# Patient Record
Sex: Male | Born: 1955 | Race: White | Hispanic: No | Marital: Married | Smoking: Former smoker
Health system: Southern US, Community
[De-identification: ages and names within clinical notes are randomized; demographics above are authoritative.]

## PROBLEM LIST (undated history)

## (undated) DIAGNOSIS — J45909 Unspecified asthma, uncomplicated: Secondary | ICD-10-CM

## (undated) DIAGNOSIS — N400 Enlarged prostate without lower urinary tract symptoms: Secondary | ICD-10-CM

## (undated) DIAGNOSIS — M5136 Other intervertebral disc degeneration, lumbar region: Secondary | ICD-10-CM

## (undated) DIAGNOSIS — G8929 Other chronic pain: Secondary | ICD-10-CM

## (undated) HISTORY — PX: HERNIA REPAIR: SHX51

## (undated) HISTORY — DX: Other intervertebral disc degeneration, lumbar region: M51.36

## (undated) HISTORY — PX: TEMPOROMANDIBULAR JOINT ARTHROPLASTY: SUR76

## (undated) HISTORY — PX: ANKLE SURGERY: SHX546

---

## 2005-05-11 ENCOUNTER — Ambulatory Visit: Payer: Self-pay | Admitting: Gastroenterology

## 2007-09-17 ENCOUNTER — Ambulatory Visit: Payer: Self-pay | Admitting: Family Medicine

## 2007-09-17 IMAGING — US ULTRASOUND RIGHT BREAST
1 series · 8 of 8 positions shown · non-contrast
Comparison: none

REASON FOR EXAM: gyencomastia
COMMENTS:

PROCEDURE:     US  - US BREAST RIGHT  - [DATE]  [DATE]
RESULT:       Prominent RIGHT retroareolar parenchymal pattern is present.
This is heterogenous tissue on ultrasound. Prominent flow is noted in this
region on ultrasound.

[Series 1: ultrasound right breast · 8 of 8 slices shown]
[im 1/8]
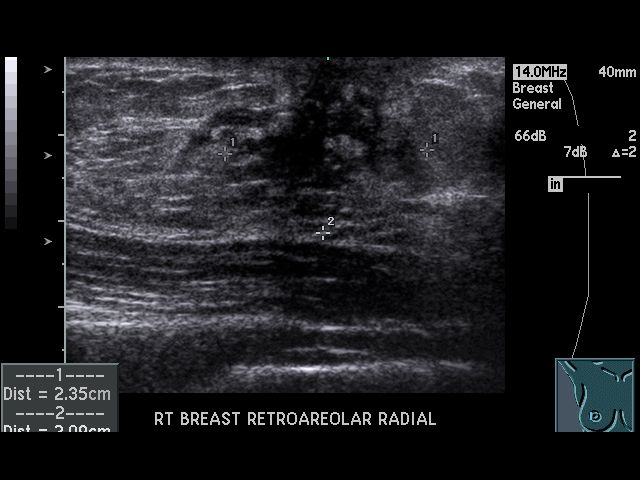
[im 2/8]
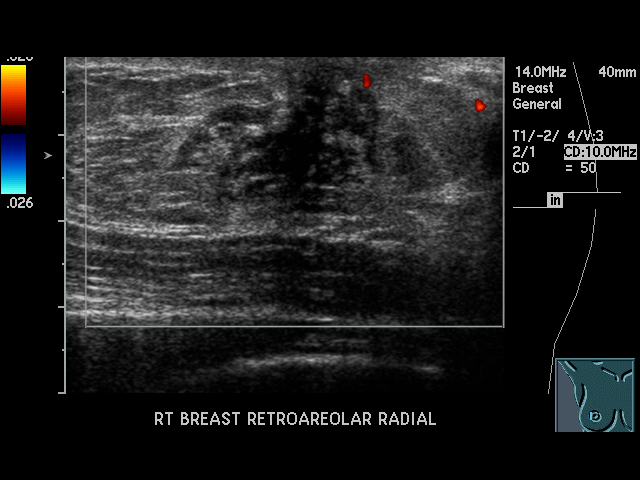
[im 3/8]
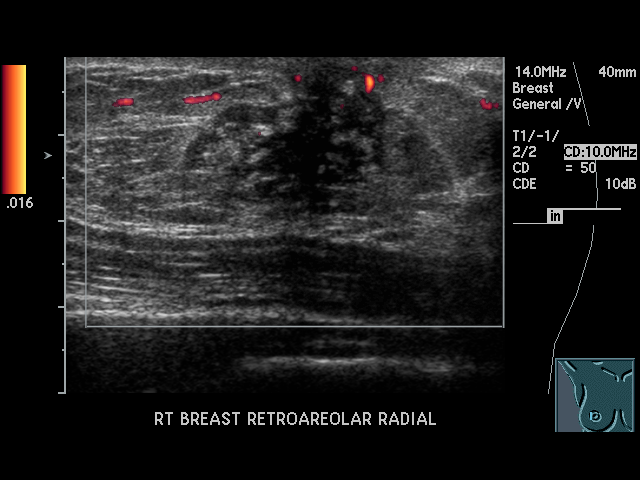
[im 4/8]
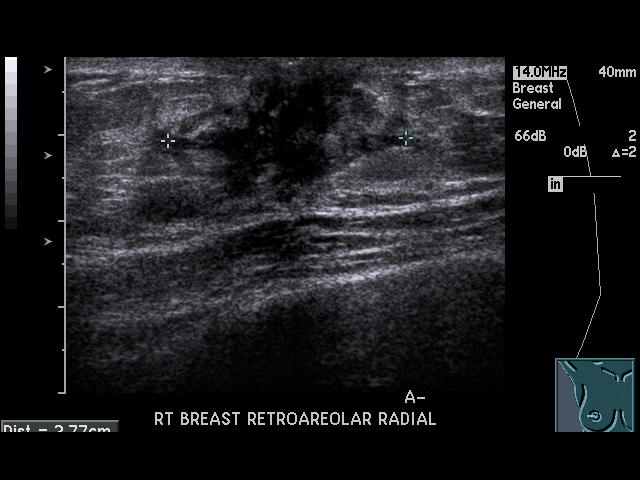
[im 5/8]
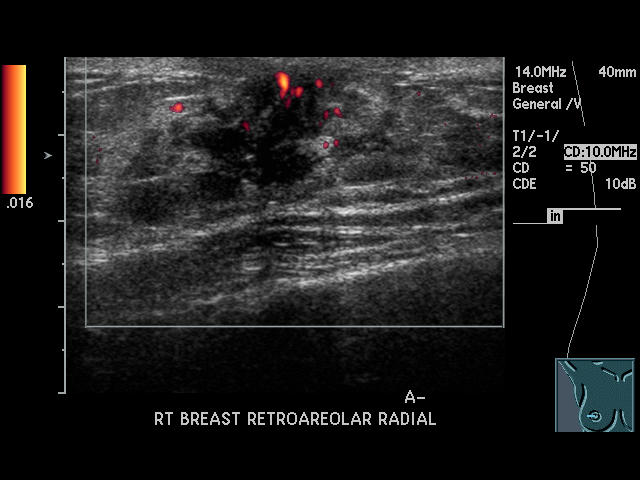
[im 6/8]
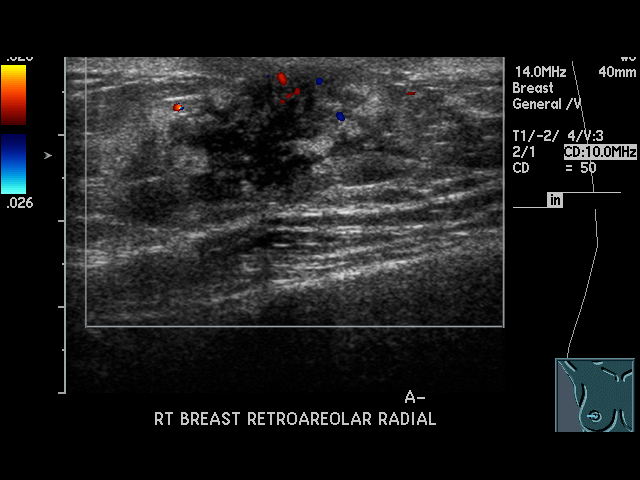
[im 7/8]
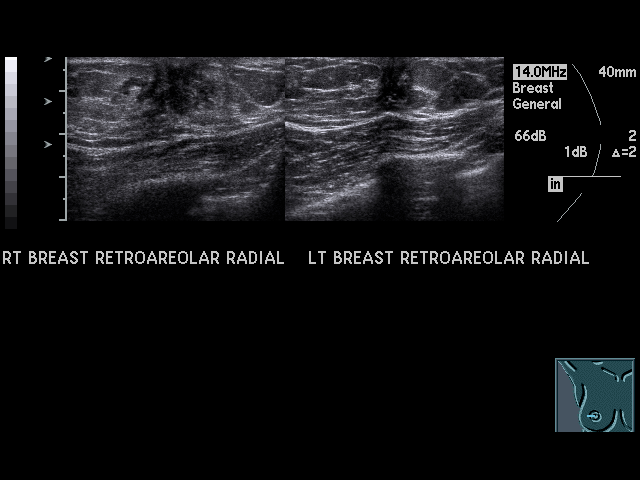
[im 8/8]
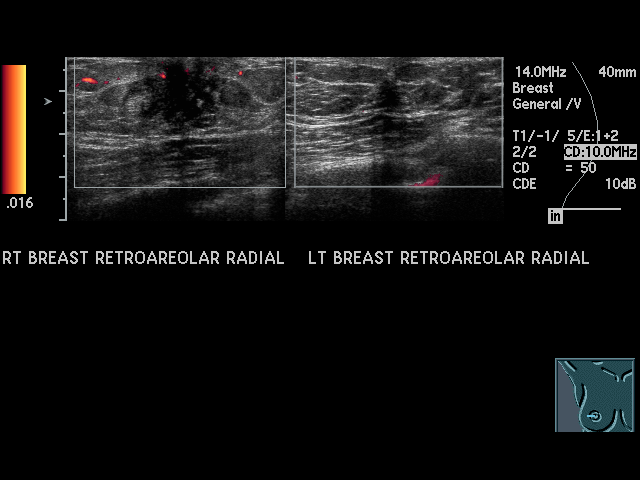

[8 of 8 positions shown; findings below may reference images not displayed]

IMPRESSION: RIGHT retroareolar parenchymal abnormality on mammography and ultrasound for
which biopsy is suggested.

## 2013-09-25 ENCOUNTER — Emergency Department: Payer: Self-pay | Admitting: Emergency Medicine

## 2017-06-16 ENCOUNTER — Other Ambulatory Visit: Payer: Self-pay

## 2017-06-16 ENCOUNTER — Ambulatory Visit
Admission: EM | Admit: 2017-06-16 | Discharge: 2017-06-16 | Disposition: A | Discharge status: Home / Self Care | Payer: Self-pay | Attending: Emergency Medicine | Admitting: Emergency Medicine

## 2017-06-16 ENCOUNTER — Encounter: Payer: Self-pay | Admitting: Gynecology

## 2017-06-16 DIAGNOSIS — J9801 Acute bronchospasm: Secondary | ICD-10-CM

## 2017-06-16 DIAGNOSIS — J029 Acute pharyngitis, unspecified: Secondary | ICD-10-CM

## 2017-06-16 DIAGNOSIS — J069 Acute upper respiratory infection, unspecified: Secondary | ICD-10-CM

## 2017-06-16 HISTORY — DX: Unspecified asthma, uncomplicated: J45.909

## 2017-06-16 HISTORY — DX: Other chronic pain: G89.29

## 2017-06-16 LAB — RAPID STREP SCREEN (MED CTR MEBANE ONLY): Streptococcus, Group A Screen (Direct): NEGATIVE

## 2017-06-16 MED ORDER — HYDROCOD POLST-CPM POLST ER 10-8 MG/5ML PO SUER: 5.0000 mL | Freq: Two times a day (BID) | ORAL | 0 refills | Status: DC | PRN

## 2017-06-16 MED ORDER — IBUPROFEN 600 MG PO TABS: 600.0000 mg | ORAL_TABLET | Freq: Four times a day (QID) | ORAL | 0 refills | Status: DC | PRN

## 2017-06-16 NOTE — ED Triage Notes (Signed)
Per patient c/o cough / sinus infection / throat irritation x 3 days.

## 2017-06-16 NOTE — ED Provider Notes (Signed)
HPI  SUBJECTIVE:  Ronnie Christensen is a 62 y.o. male who presents with sore throat, postnasal drip, headaches, raspy voice, nonproductive cough for 2 days.  He reports wheezing last night.  Reports chest tightness and chest soreness secondary to the cough.  Reports shortness of breath but denies dyspnea on exertion no nasal congestion, rhinorrhea, sinus pain or pressure.  States that he has felt feverish but has had no documented fevers.  Reports nausea, no vomiting this morning.  No body aches, allergy symptoms, muscles hot potato voice, drooling, trismus.  No abdominal pain, rash, neck stiffness.  He has had contacts with strep.  He has tried aspirin, Zanaflex, Robitussin, Flonase without improvement in symptoms.  Symptoms are worse with swallowing.  His main concern is to chest tightness and soreness.  No antibiotics in the past month.  No antipyretic in the past 6-8 hours.  He has a past medical history of asthma and has an albuterol inhaler that he uses as needed.  He has not used it yet.  He does not have a spacer.  Also a past medical history of chronic joint pain.  PMD: Dr. Jacqlyn Larsen, his urologist.   Past Medical History:  Diagnosis Date  . Asthma   . Chronic pain     Past Surgical History:  Procedure Laterality Date  . ANKLE SURGERY Left   . TEMPOROMANDIBULAR JOINT ARTHROPLASTY      Family History  Problem Relation Age of Onset  . Prostate cancer Mother   . Prostate cancer Father     Social History   Tobacco Use  . Smoking status: Never Smoker  . Smokeless tobacco: Never Used  Substance Use Topics  . Alcohol use: Yes  . Drug use: Never    No current facility-administered medications for this encounter.   Current Outpatient Medications:  .  albuterol (PROAIR HFA) 108 (90 Base) MCG/ACT inhaler, USE 2 PUFFS INTO THE LUNGS EVERY 6 HOURS AS NEEDED, Disp: , Rfl:  .  tiZANidine (ZANAFLEX) 4 MG tablet, Take 4 mg by mouth., Disp: , Rfl:  .  chlorpheniramine-HYDROcodone (TUSSIONEX  PENNKINETIC ER) 10-8 MG/5ML SUER, Take 5 mLs by mouth every 12 (twelve) hours as needed for cough., Disp: 120 mL, Rfl: 0 .  ibuprofen (ADVIL,MOTRIN) 600 MG tablet, Take 1 tablet (600 mg total) by mouth every 6 (six) hours as needed., Disp: 30 tablet, Rfl: 0  No Known Allergies   ROS  As noted in HPI.   Physical Exam  BP 128/78 (BP Location: Left Arm)   Pulse 79   Temp 98 F (36.7 C) (Oral)   Resp 16   Ht 6\' 1"  (1.854 m)   Wt 210 lb (95.3 kg)   SpO2 99%   BMI 27.71 kg/m   Constitutional: Well developed, well nourished, no acute distress Eyes:  EOMI, conjunctiva normal bilaterally HENT: Normocephalic, atraumatic,mucus membranes moist.  Mild nasal congestion.  Normal turbinates.  No sinus tenderness.  Normal tonsils without exudates.  Erythematous oropharynx with cobblestoning, postnasal drip, uvula midline. Neck: Positive shotty cervical lymphadenopathy Respiratory: Normal inspiratory effort, lungs clear bilaterally.  Positive anterior chest wall tenderness Cardiovascular: Normal rate, regular rhythm, no murmurs, rubs, gallops  GI: nondistended skin: No rash, skin intact Musculoskeletal: no deformities Neurologic: Alert & oriented x 3, no focal neuro deficits Psychiatric: Speech and behavior appropriate   ED Course   Medications - No data to display  Orders Placed This Encounter  Procedures  . Rapid strep screen    Standing Status:  Standing    Number of Occurrences:   1    Order Specific Question:   Patient immune status    Answer:   Normal  . Culture, group A strep    Standing Status:   Standing    Number of Occurrences:   1    Results for orders placed or performed during the hospital encounter of 06/16/17 (from the past 24 hour(s))  Rapid strep screen     Status: None   Collection Time: 06/16/17 10:10 AM  Result Value Ref Range   Streptococcus, Group A Screen (Direct) NEGATIVE NEGATIVE   No results found.  ED Clinical Impression  Upper respiratory  tract infection, unspecified type  Acute pharyngitis, unspecified etiology  Bronchospasm   ED Assessment/Plan  [place] Narcotic database reviewed for this patient, and feel that the risk/benefit ratio today is favorable for proceeding with a prescription for controlled substance.  No opiate prescriptions in the past 2 years.  No evidence of sinusitis.  Will check a strep because he is complaining of  sore throat.  His other complaint is chest tightness, so suspect that this is a URI with some bronchospasm/mild asthma.  If his strep is negative, we will have him start 2 puffs from his albuterol inhaler every 4-6 hours using his spacer.  He states he does not need a prescription for the albuterol.  He will start Flonase, start saline nasal irrigation.  Advised Benadryl/Maalox mixture, ibuprofen 600 mg to take with 1 g of Tylenol, and Tussionex to help with the cough.  Do not think that he needs steroids at this point in time.  We will give him a primary care referral list for him to follow-up with as needed.  Discussed labs, medical decision making, plan for follow-up with patient.  He agrees with plan  Rapid strep negative.  Plan As above.  Meds ordered this encounter  Medications  . chlorpheniramine-HYDROcodone (TUSSIONEX PENNKINETIC ER) 10-8 MG/5ML SUER    Sig: Take 5 mLs by mouth every 12 (twelve) hours as needed for cough.    Dispense:  120 mL    Refill:  0  . ibuprofen (ADVIL,MOTRIN) 600 MG tablet    Sig: Take 1 tablet (600 mg total) by mouth every 6 (six) hours as needed.    Dispense:  30 tablet    Refill:  0    *This clinic note was created using [employer]. Therefore, there may be occasional mistakes despite careful proofreading.   ?    Melynda Ripple, MD 06/17/17 [phone]

## 2017-06-16 NOTE — Discharge Instructions (Addendum)
2 puffs from your albuterol inhaler every 4-6 hours using the spacer. start Flonase, start saline nasal irrigation. Tussionex to help with the cough.   your rapid strep was negative today, so we have sent off a throat culture.  We will contact you and call in the appropriate antibiotics if your culture comes back positive for an infection requiring antibiotic treatment.  Give [place] a working phone number.  If you were given a prescription for antibiotics, you may want to wait and fill it until you know the results of the culture.  1 gram of Tylenol and 600 mg ibuprofen together 3-4 times a day as needed for pain.  Make sure you drink plenty of extra fluids.  Some people find salt water gargles and  Traditional Medic[employer]'s "Throat Coat" tea helpful. Take 5 mL of liquid Benadryl and 5 mL of Maalox. Mix it together, and then hold it in your mouth for as long as you can and then swallow. You may do this 4 times a day.   Here is a list of primary care providers who are taking new patients:  Dr. Otilio Miu, Dr. Adline Potter [address] [place] [address] [place] [address]  [phone]  [facility] [address] [place], [place] [address] [phone]  [facility]  [phone] [place], [place] [address]  Here are clinics/ other resources who will see you if you do not have insurance. Some have certain criteria that you must meet. Call them and find out what they are:  [facility]: [address] [place], [place] [address] Phone: [phone] Hours: First and Third Saturdays of each Month, 9 a.m. - 1 p.m.  [facility]: [address], [place], [place], [place] [address] Phone: [phone] Hours: Tuesday, 4 p.m. - 8 p.m. Thursday, 1 p.m. - 8 p.m. Wednesday, 9 a.m. - [facility] [address], [place], [place] [address] Phone:  [phone] Pharmacy Phone Number: [phone] Dental Phone Number: [phone] [place] Help: [phone]  Dental Hours: Monday - Thursday, 8 a.m. - 6 p.m.  [place] [address] [place], [place] [address] Phone: [phone] Pharmacy Phone Number: [phone] [facility] Insurance Help: [phone]  [facility] [address] [place], [place] [address] Phone: [phone] Pharmacy Phone Number: [phone] [facility] Insurance Help: [phone]  [facility] [address] [place], [place] [address] Phone: [phone] [facility] Insurance Help: [phone]   [address] [place], [place] [address] Phone: [phone]  Go to www.goodrx.com to look up your medications. This will give you a list of where you can find your prescriptions at the most affordable prices. Or ask the pharmacist what the cash price is, or if they have any other discount programs available to help make your medication more affordable. This can be less expensive than what you would pay with insurance.

## 2017-06-19 LAB — CULTURE, GROUP A STREP (THRC)

## 2017-06-20 ENCOUNTER — Telehealth: Payer: Self-pay | Admitting: Emergency Medicine

## 2017-06-20 NOTE — Telephone Encounter (Signed)
Patient seen here over the weekend called regarding culture results.  This nurse returned the call, demographics verified. resutls communicated, patient verbalized understanding , thanked for call and care. nmw

## 2018-08-09 ENCOUNTER — Ambulatory Visit
Admission: EM | Admit: 2018-08-09 | Discharge: 2018-08-09 | Disposition: A | Discharge status: Home / Self Care | Payer: Medicare Other | Attending: Family Medicine | Admitting: Family Medicine

## 2018-08-09 ENCOUNTER — Other Ambulatory Visit: Payer: Self-pay

## 2018-08-09 ENCOUNTER — Encounter: Payer: Self-pay | Admitting: Emergency Medicine

## 2018-08-09 DIAGNOSIS — M546 Pain in thoracic spine: Secondary | ICD-10-CM | POA: Diagnosis not present

## 2018-08-09 DIAGNOSIS — G8929 Other chronic pain: Secondary | ICD-10-CM | POA: Diagnosis not present

## 2018-08-09 DIAGNOSIS — Z760 Encounter for issue of repeat prescription: Secondary | ICD-10-CM

## 2018-08-09 MED ORDER — TIZANIDINE HCL 4 MG PO CAPS: 4.0000 mg | ORAL_CAPSULE | Freq: Three times a day (TID) | ORAL | 0 refills | Status: DC | PRN

## 2018-08-09 NOTE — Discharge Instructions (Signed)
Follow up with PCP

## 2018-08-09 NOTE — ED Provider Notes (Signed)
[facility]    CSN: [id number] Arrival date & time: 08/09/18  1535     History   Chief Complaint Chief Complaint  Patient presents with  . Medication Refill    HPI Ronnie Christensen is a 63 y.o. male.   63 yo male with a c/o medication refill on his zanaflex. States he has been using it for mid and upper back pain for years, however this was being prescribed by a urologist per patient. (This was noted by urologist in his chart as well). No other concerns. States he uses it sparingly.   The history is provided by the patient.  Medication Refill    Past Medical History:  Diagnosis Date  . Asthma   . Chronic pain     There are no active problems to display for this patient.   Past Surgical History:  Procedure Laterality Date  . ANKLE SURGERY Left   . TEMPOROMANDIBULAR JOINT ARTHROPLASTY         Home Medications    Prior to Admission medications   Medication Sig Start Date End Date Taking? Authorizing Provider  tiZANidine (ZANAFLEX) 4 MG tablet Take 4 mg by mouth. 09/11/16  Yes [provider]  albuterol (PROAIR HFA) 108 (90 Base) MCG/ACT inhaler USE 2 PUFFS INTO THE LUNGS EVERY 6 HOURS AS NEEDED 06/22/14   [provider]  chlorpheniramine-HYDROcodone (TUSSIONEX PENNKINETIC ER) 10-8 MG/5ML SUER Take 5 mLs by mouth every 12 (twelve) hours as needed for cough. 06/16/17   Melynda Ripple, MD  ibuprofen (ADVIL,MOTRIN) 600 MG tablet Take 1 tablet (600 mg total) by mouth every 6 (six) hours as needed. 06/16/17   Melynda Ripple, MD  tiZANidine (ZANAFLEX) 4 MG capsule Take 1 capsule (4 mg total) by mouth 3 (three) times daily as needed for muscle spasms. 08/09/18   Norval Gable, MD    Family History Family History  Problem Relation Age of Onset  . Prostate cancer Mother   . Prostate cancer Father     Social History Social History   Tobacco Use  . Smoking status: Never Smoker  . Smokeless tobacco: Never Used  Substance Use Topics  .  Alcohol use: Yes  . Drug use: Never     Allergies   Patient has no known allergies.   Review of Systems Review of Systems   Physical Exam Triage Vital Signs ED Triage Vitals  Enc Vitals Group     BP 08/09/18 1624 131/86     Pulse Rate 08/09/18 1624 93     Resp 08/09/18 1624 18     Temp 08/09/18 1624 97.9 F (36.6 C)     Temp Source 08/09/18 1624 Oral     SpO2 08/09/18 1624 97 %     Weight 08/09/18 1623 210 lb (95.3 kg)     Height 08/09/18 1623 6' (1.829 m)     Head Circumference --      Peak Flow --      Pain Score 08/09/18 1623 0     Pain Loc --      Pain Edu? --      Excl. in [place]? --    No data found.  Updated Vital Signs BP 131/86 (BP Location: Right Arm)   Pulse 93   Temp 97.9 F (36.6 C) (Oral)   Resp 18   Ht 6' (1.829 m)   Wt 95.3 kg   SpO2 97%   BMI 28.48 kg/m   Visual Acuity Right Eye Distance:   Left  Eye Distance:   Bilateral Distance:    Right Eye Near:   Left Eye Near:    Bilateral Near:     Physical Exam Vitals signs and nursing note reviewed.  Constitutional:      General: He is not in acute distress.    Appearance: He is not toxic-appearing or diaphoretic.  Neurological:     Mental Status: He is alert.      UC Treatments / Results  Labs (all labs ordered are listed, but only abnormal results are displayed) Labs Reviewed - No data to display  EKG None  Radiology No results found.  Procedures Procedures (including critical care time)  Medications Ordered in UC Medications - No data to display  Initial Impression / Assessment and Plan / UC Course  I have reviewed the triage vital signs and the nursing notes.  Pertinent labs & imaging results that were available during my care of the patient were reviewed by me and considered in my medical decision making (see chart for details).      Final Clinical Impressions(s) / UC Diagnoses   Final diagnoses:  Chronic thoracic back pain, unspecified back pain laterality   Medication refill     Discharge Instructions     Follow up with PCP    ED Prescriptions    Medication Sig Dispense Auth. Provider   tiZANidine (ZANAFLEX) 4 MG capsule Take 1 capsule (4 mg total) by mouth 3 (three) times daily as needed for muscle spasms. 15 capsule Norval Gable, MD     1. diagnosis reviewed with patient 2. rx as per orders above; reviewed possible side effects, interactions, risks and benefits  3. Follow-up prn if symptoms worsen or don't improve   Controlled Substance Prescriptions [place] Controlled Substance Registry consulted? Not Applicable   Norval Gable, MD 08/09/18 [phone]

## 2018-08-09 NOTE — ED Triage Notes (Signed)
Patient needs refill for Zanaflex 4mg . He is going out of town. He cannot get in to see his primary care physician until June 15th due to his new insurance.

## 2018-09-02 DIAGNOSIS — Z1211 Encounter for screening for malignant neoplasm of colon: Secondary | ICD-10-CM | POA: Diagnosis not present

## 2018-09-02 DIAGNOSIS — M542 Cervicalgia: Secondary | ICD-10-CM | POA: Diagnosis not present

## 2018-09-02 DIAGNOSIS — Z0000 Encounter for general adult medical examination without abnormal findings: Secondary | ICD-10-CM | POA: Diagnosis not present

## 2018-09-02 DIAGNOSIS — R52 Pain, unspecified: Secondary | ICD-10-CM | POA: Diagnosis not present

## 2018-09-16 DIAGNOSIS — L720 Epidermal cyst: Secondary | ICD-10-CM | POA: Diagnosis not present

## 2018-09-16 DIAGNOSIS — D3610 Benign neoplasm of peripheral nerves and autonomic nervous system, unspecified: Secondary | ICD-10-CM | POA: Diagnosis not present

## 2018-09-16 DIAGNOSIS — B999 Unspecified infectious disease: Secondary | ICD-10-CM | POA: Diagnosis not present

## 2018-09-16 DIAGNOSIS — N611 Abscess of the breast and nipple: Secondary | ICD-10-CM | POA: Diagnosis not present

## 2018-11-19 DIAGNOSIS — N400 Enlarged prostate without lower urinary tract symptoms: Secondary | ICD-10-CM | POA: Diagnosis not present

## 2018-11-19 DIAGNOSIS — Z136 Encounter for screening for cardiovascular disorders: Secondary | ICD-10-CM | POA: Diagnosis not present

## 2018-11-19 DIAGNOSIS — Z0000 Encounter for general adult medical examination without abnormal findings: Secondary | ICD-10-CM | POA: Diagnosis not present

## 2018-11-26 DIAGNOSIS — Z1212 Encounter for screening for malignant neoplasm of rectum: Secondary | ICD-10-CM | POA: Diagnosis not present

## 2018-11-26 DIAGNOSIS — Z1211 Encounter for screening for malignant neoplasm of colon: Secondary | ICD-10-CM | POA: Diagnosis not present

## 2018-12-10 DIAGNOSIS — L02212 Cutaneous abscess of back [any part, except buttock]: Secondary | ICD-10-CM | POA: Diagnosis not present

## 2018-12-23 DIAGNOSIS — M25571 Pain in right ankle and joints of right foot: Secondary | ICD-10-CM | POA: Diagnosis not present

## 2018-12-23 DIAGNOSIS — M19171 Post-traumatic osteoarthritis, right ankle and foot: Secondary | ICD-10-CM | POA: Diagnosis not present

## 2019-01-06 DIAGNOSIS — L02212 Cutaneous abscess of back [any part, except buttock]: Secondary | ICD-10-CM | POA: Diagnosis not present

## 2019-01-07 DIAGNOSIS — M25552 Pain in left hip: Secondary | ICD-10-CM | POA: Diagnosis not present

## 2019-01-07 DIAGNOSIS — M1612 Unilateral primary osteoarthritis, left hip: Secondary | ICD-10-CM | POA: Diagnosis not present

## 2019-01-07 DIAGNOSIS — M47816 Spondylosis without myelopathy or radiculopathy, lumbar region: Secondary | ICD-10-CM | POA: Diagnosis not present

## 2019-01-07 DIAGNOSIS — M25551 Pain in right hip: Secondary | ICD-10-CM | POA: Diagnosis not present

## 2019-01-07 DIAGNOSIS — M7061 Trochanteric bursitis, right hip: Secondary | ICD-10-CM | POA: Diagnosis not present

## 2019-01-07 DIAGNOSIS — M7062 Trochanteric bursitis, left hip: Secondary | ICD-10-CM | POA: Diagnosis not present

## 2019-02-18 DIAGNOSIS — M7061 Trochanteric bursitis, right hip: Secondary | ICD-10-CM | POA: Diagnosis not present

## 2019-02-18 DIAGNOSIS — M25552 Pain in left hip: Secondary | ICD-10-CM | POA: Diagnosis not present

## 2019-02-18 DIAGNOSIS — M25551 Pain in right hip: Secondary | ICD-10-CM | POA: Diagnosis not present

## 2019-02-18 DIAGNOSIS — G8929 Other chronic pain: Secondary | ICD-10-CM | POA: Diagnosis not present

## 2019-02-18 DIAGNOSIS — M25571 Pain in right ankle and joints of right foot: Secondary | ICD-10-CM | POA: Diagnosis not present

## 2019-02-18 DIAGNOSIS — M19171 Post-traumatic osteoarthritis, right ankle and foot: Secondary | ICD-10-CM | POA: Diagnosis not present

## 2019-02-18 DIAGNOSIS — M7062 Trochanteric bursitis, left hip: Secondary | ICD-10-CM | POA: Diagnosis not present

## 2019-02-24 DIAGNOSIS — M19171 Post-traumatic osteoarthritis, right ankle and foot: Secondary | ICD-10-CM | POA: Diagnosis not present

## 2019-02-24 DIAGNOSIS — G8929 Other chronic pain: Secondary | ICD-10-CM | POA: Diagnosis not present

## 2019-02-24 DIAGNOSIS — M25571 Pain in right ankle and joints of right foot: Secondary | ICD-10-CM | POA: Diagnosis not present

## 2019-03-03 DIAGNOSIS — M25571 Pain in right ankle and joints of right foot: Secondary | ICD-10-CM | POA: Diagnosis not present

## 2019-03-03 DIAGNOSIS — G8929 Other chronic pain: Secondary | ICD-10-CM | POA: Diagnosis not present

## 2019-03-03 DIAGNOSIS — M19171 Post-traumatic osteoarthritis, right ankle and foot: Secondary | ICD-10-CM | POA: Diagnosis not present

## 2019-03-10 DIAGNOSIS — M19171 Post-traumatic osteoarthritis, right ankle and foot: Secondary | ICD-10-CM | POA: Diagnosis not present

## 2019-03-10 DIAGNOSIS — M25571 Pain in right ankle and joints of right foot: Secondary | ICD-10-CM | POA: Diagnosis not present

## 2019-03-10 DIAGNOSIS — G8929 Other chronic pain: Secondary | ICD-10-CM | POA: Diagnosis not present

## 2019-04-17 ENCOUNTER — Other Ambulatory Visit: Payer: Self-pay | Admitting: Sports Medicine

## 2019-04-17 DIAGNOSIS — G8929 Other chronic pain: Secondary | ICD-10-CM

## 2019-04-17 DIAGNOSIS — M47816 Spondylosis without myelopathy or radiculopathy, lumbar region: Secondary | ICD-10-CM

## 2019-04-17 DIAGNOSIS — M5442 Lumbago with sciatica, left side: Secondary | ICD-10-CM

## 2019-04-17 DIAGNOSIS — M25552 Pain in left hip: Secondary | ICD-10-CM

## 2019-04-17 DIAGNOSIS — M25551 Pain in right hip: Secondary | ICD-10-CM

## 2019-04-24 ENCOUNTER — Other Ambulatory Visit: Payer: Self-pay

## 2019-04-24 ENCOUNTER — Ambulatory Visit
Admission: RE | Admit: 2019-04-24 | Discharge: 2019-04-24 | Disposition: A | Discharge status: Home / Self Care | Payer: PPO | Source: Ambulatory Visit | Attending: Sports Medicine | Admitting: Sports Medicine

## 2019-04-24 DIAGNOSIS — G8929 Other chronic pain: Secondary | ICD-10-CM | POA: Insufficient documentation

## 2019-04-24 DIAGNOSIS — M25552 Pain in left hip: Secondary | ICD-10-CM | POA: Insufficient documentation

## 2019-04-24 DIAGNOSIS — M5441 Lumbago with sciatica, right side: Secondary | ICD-10-CM | POA: Insufficient documentation

## 2019-04-24 DIAGNOSIS — M545 Low back pain: Secondary | ICD-10-CM | POA: Diagnosis not present

## 2019-04-24 DIAGNOSIS — M5442 Lumbago with sciatica, left side: Secondary | ICD-10-CM | POA: Diagnosis not present

## 2019-04-24 DIAGNOSIS — M25551 Pain in right hip: Secondary | ICD-10-CM | POA: Insufficient documentation

## 2019-04-24 DIAGNOSIS — M47816 Spondylosis without myelopathy or radiculopathy, lumbar region: Secondary | ICD-10-CM | POA: Diagnosis not present

## 2019-04-24 IMAGING — MR MR LUMBAR SPINE W/O CM
5 series · 31 of 48 positions shown · non-contrast
Comparison: None.

CLINICAL DATA: Right side low back pain, right lower extremity pain

EXAM:
MRI LUMBAR SPINE WITHOUT CONTRAST
TECHNIQUE: Multiplanar, multisequence MR imaging of the lumbar spine was
performed. No intravenous contrast was administered.

[Series 9: T2 · sagittal · 4.0mm · 0.81mm/px · 6 of 17 slices shown (1 of 2)]
[im 1/17]
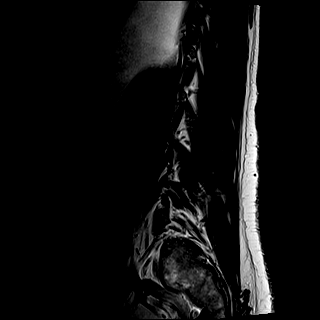
[im 4/17]
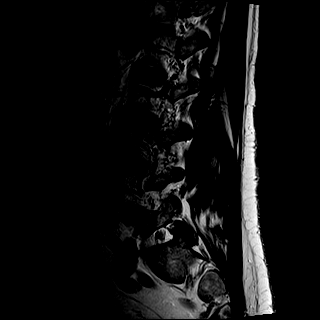
[im 7/17]
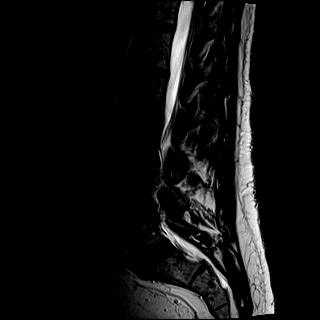
[im 10/17]
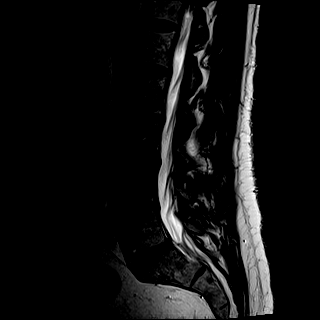
[im 13/17]
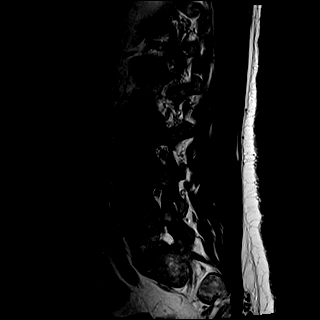
[im 17/17]
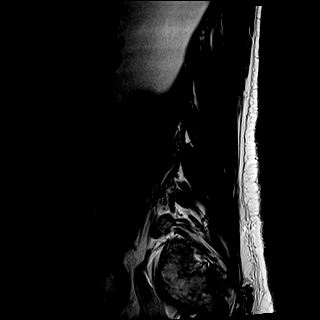

[Series 10: T1 · sagittal · 4.0mm · 0.81mm/px · 6 of 17 slices shown (1 of 2)]
[im 1/17]
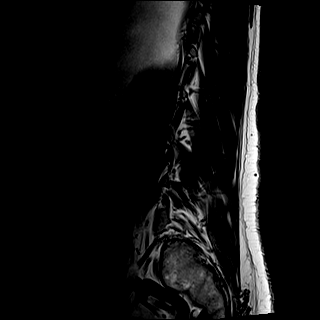
[im 4/17]
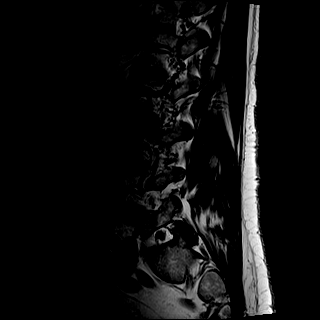
[im 7/17]
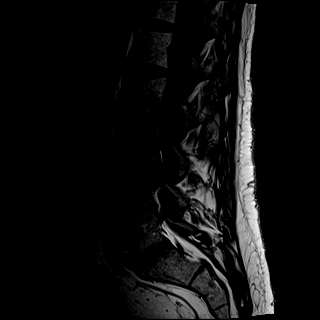
[im 10/17]
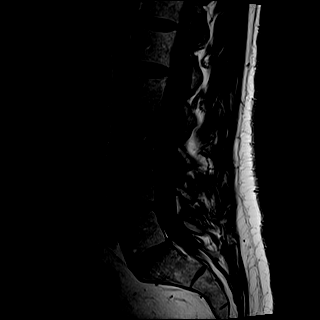
[im 13/17]
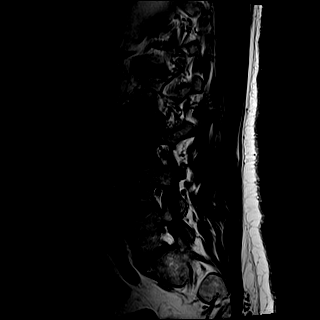
[im 17/17]
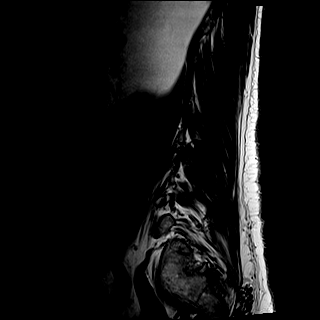

[Series 11: STIR · sagittal · 4.0mm · 0.41mm/px · 1 of 17 slices shown]
[im 1/17]
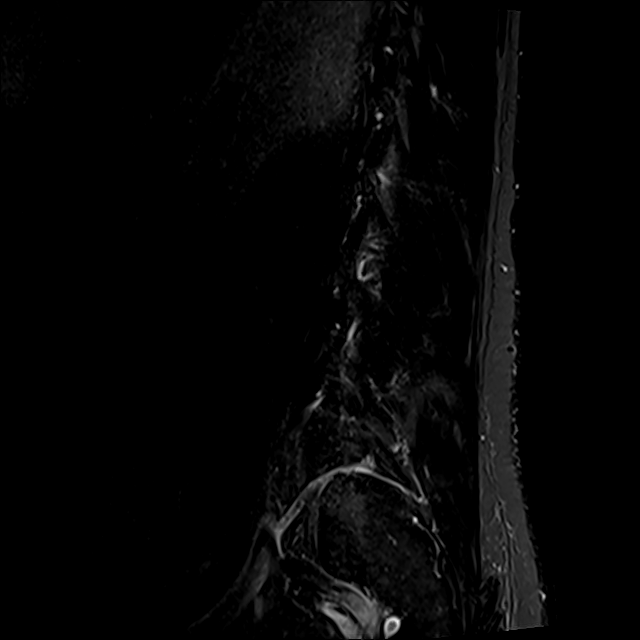

[Series 12: T2 · axial · 4.0mm · 0.78mm/px · z∈[-5,+225]mm · 9 of 40 slices shown (2 of 2)]
[im 1/40]
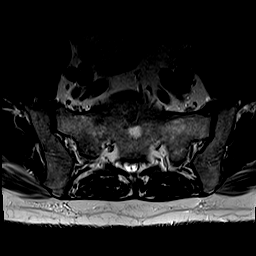
[im 6/40]
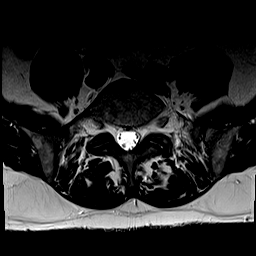
[im 12/40]
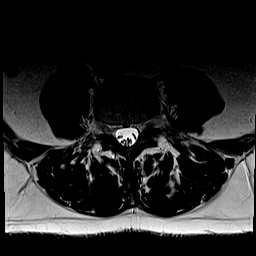
[im 17/40]
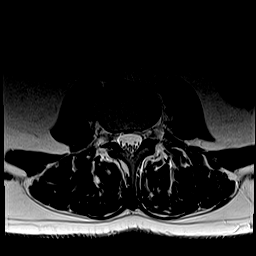
[im 20/40]
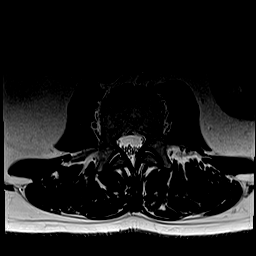
[im 23/40]
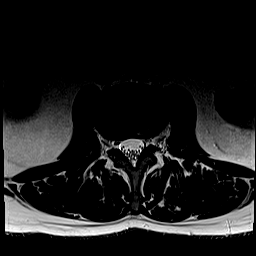
[im 28/40]
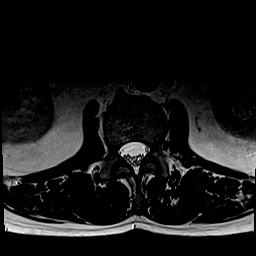
[im 34/40]
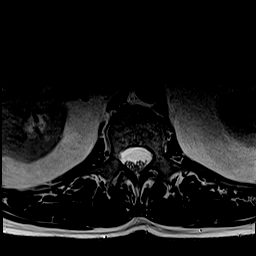
[im 40/40]
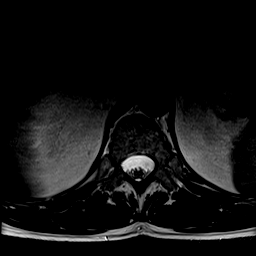

[Series 13: T1 · axial · 4.0mm · 0.39mm/px · z∈[-5,+225]mm · 9 of 40 slices shown (2 of 2)]
[im 1/40]
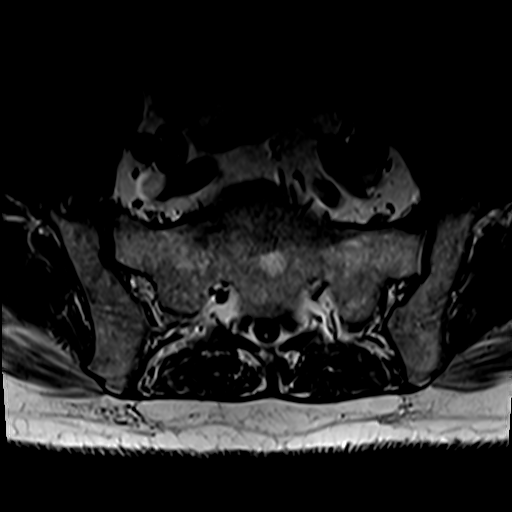
[im 6/40]
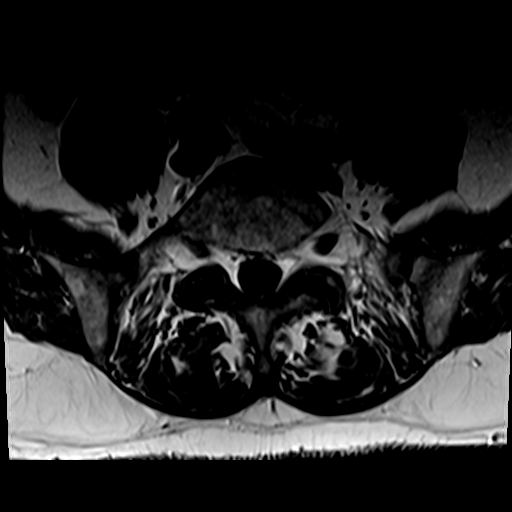
[im 12/40]
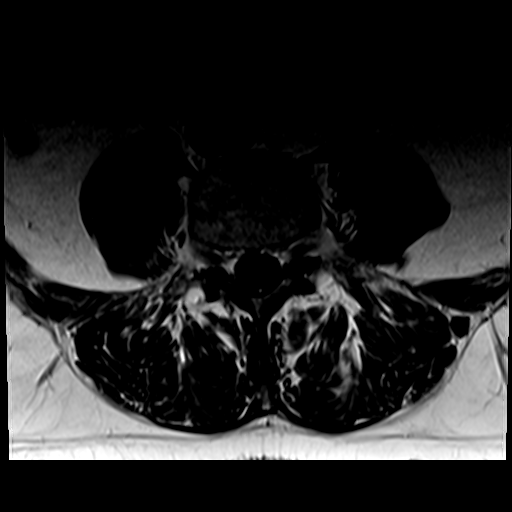
[im 17/40]
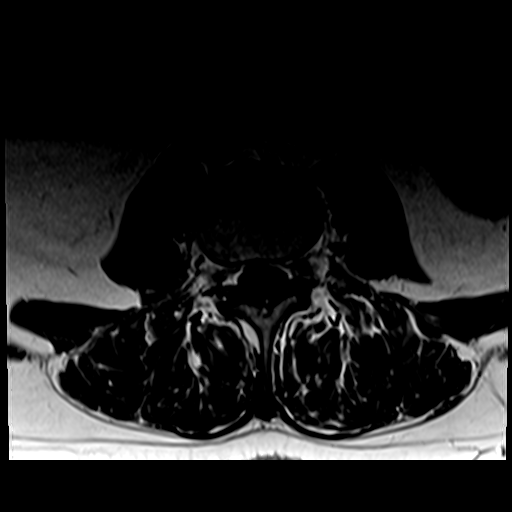
[im 20/40]
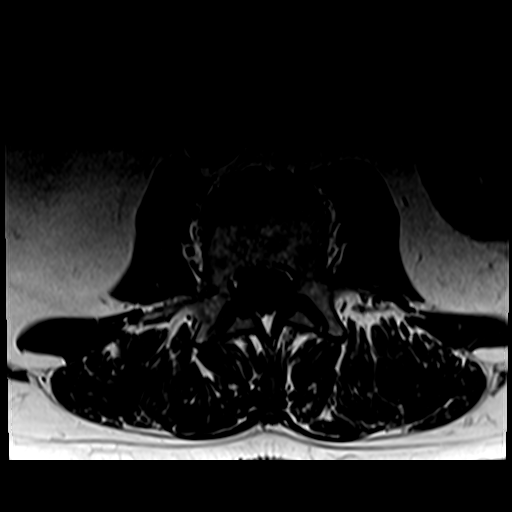
[im 23/40]
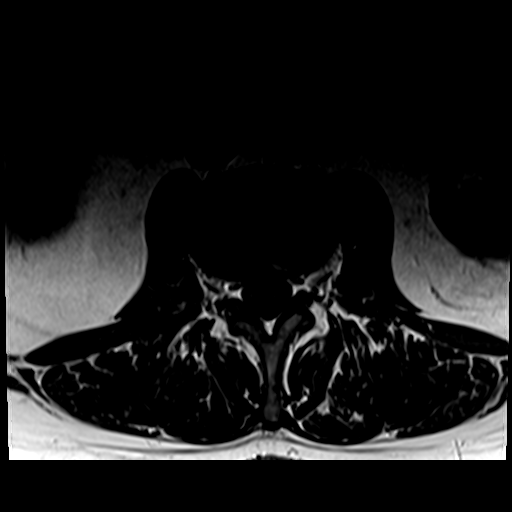
[im 28/40]
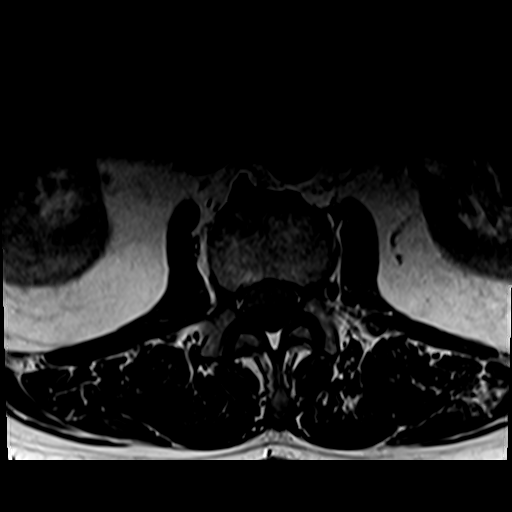
[im 34/40]
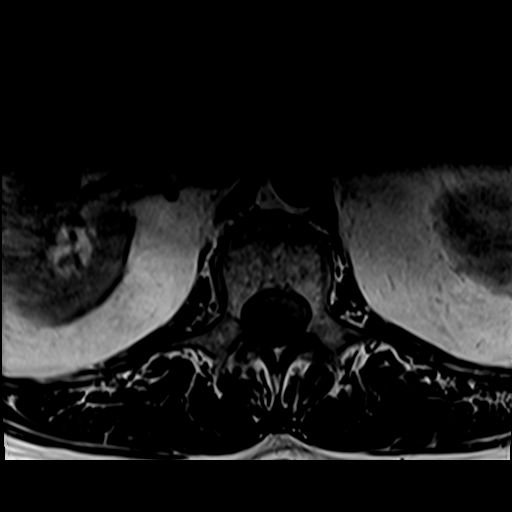
[im 40/40]
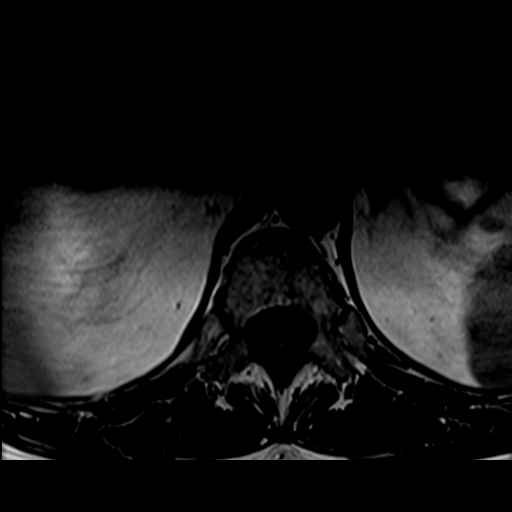

[31 of 48 positions shown; findings below may reference images not displayed]

FINDINGS: Segmentation:  Standard.

Alignment:  No significant listhesis

Vertebrae: Vertebral body heights are maintained. There is trace
degenerative endplate marrow edema. Probable small hemangioma within
the S1. No suspicious osseous lesion.

Conus medullaris and cauda equina: Conus extends to the L1 level.
Conus and cauda equina appear normal.

Paraspinal and other soft tissues: Unremarkable.

Disc levels:

L1-L2:  No canal or foraminal stenosis.

L2-L3: Minimal disc bulge. No significant canal or foraminal
stenosis.

L3-L4: Minimal disc bulge with superimposed small central
protrusion. Minor facet arthropathy. No significant canal or
foraminal stenosis.

L4-L5: Minimal disc bulge with central annular fissure. Minor facet
arthropathy. No significant canal or foraminal stenosis.

L5-S1: Central annular fissure. Minor facet arthropathy. No
significant canal or foraminal stenosis.
IMPRESSION: Mild degenerative changes without significant stenosis.

## 2019-05-05 DIAGNOSIS — M5136 Other intervertebral disc degeneration, lumbar region: Secondary | ICD-10-CM | POA: Diagnosis not present

## 2019-05-05 DIAGNOSIS — M62830 Muscle spasm of back: Secondary | ICD-10-CM | POA: Diagnosis not present

## 2019-05-12 DIAGNOSIS — M19171 Post-traumatic osteoarthritis, right ankle and foot: Secondary | ICD-10-CM | POA: Diagnosis not present

## 2019-05-12 DIAGNOSIS — G8929 Other chronic pain: Secondary | ICD-10-CM | POA: Diagnosis not present

## 2019-05-12 DIAGNOSIS — M25571 Pain in right ankle and joints of right foot: Secondary | ICD-10-CM | POA: Diagnosis not present

## 2019-05-20 DIAGNOSIS — M5416 Radiculopathy, lumbar region: Secondary | ICD-10-CM | POA: Diagnosis not present

## 2019-05-20 DIAGNOSIS — M5136 Other intervertebral disc degeneration, lumbar region: Secondary | ICD-10-CM | POA: Diagnosis not present

## 2019-05-21 DIAGNOSIS — M546 Pain in thoracic spine: Secondary | ICD-10-CM | POA: Diagnosis not present

## 2019-05-21 DIAGNOSIS — M9901 Segmental and somatic dysfunction of cervical region: Secondary | ICD-10-CM | POA: Diagnosis not present

## 2019-05-21 DIAGNOSIS — M9902 Segmental and somatic dysfunction of thoracic region: Secondary | ICD-10-CM | POA: Diagnosis not present

## 2019-05-21 DIAGNOSIS — M5441 Lumbago with sciatica, right side: Secondary | ICD-10-CM | POA: Diagnosis not present

## 2019-05-21 DIAGNOSIS — M531 Cervicobrachial syndrome: Secondary | ICD-10-CM | POA: Diagnosis not present

## 2019-05-21 DIAGNOSIS — M9903 Segmental and somatic dysfunction of lumbar region: Secondary | ICD-10-CM | POA: Diagnosis not present

## 2019-05-29 DIAGNOSIS — M9902 Segmental and somatic dysfunction of thoracic region: Secondary | ICD-10-CM | POA: Diagnosis not present

## 2019-05-29 DIAGNOSIS — M9903 Segmental and somatic dysfunction of lumbar region: Secondary | ICD-10-CM | POA: Diagnosis not present

## 2019-05-29 DIAGNOSIS — M546 Pain in thoracic spine: Secondary | ICD-10-CM | POA: Diagnosis not present

## 2019-05-29 DIAGNOSIS — M531 Cervicobrachial syndrome: Secondary | ICD-10-CM | POA: Diagnosis not present

## 2019-05-29 DIAGNOSIS — M5441 Lumbago with sciatica, right side: Secondary | ICD-10-CM | POA: Diagnosis not present

## 2019-05-29 DIAGNOSIS — M9901 Segmental and somatic dysfunction of cervical region: Secondary | ICD-10-CM | POA: Diagnosis not present

## 2019-06-05 DIAGNOSIS — M9903 Segmental and somatic dysfunction of lumbar region: Secondary | ICD-10-CM | POA: Diagnosis not present

## 2019-06-05 DIAGNOSIS — M9902 Segmental and somatic dysfunction of thoracic region: Secondary | ICD-10-CM | POA: Diagnosis not present

## 2019-06-05 DIAGNOSIS — M546 Pain in thoracic spine: Secondary | ICD-10-CM | POA: Diagnosis not present

## 2019-06-05 DIAGNOSIS — M531 Cervicobrachial syndrome: Secondary | ICD-10-CM | POA: Diagnosis not present

## 2019-06-05 DIAGNOSIS — M5441 Lumbago with sciatica, right side: Secondary | ICD-10-CM | POA: Diagnosis not present

## 2019-06-05 DIAGNOSIS — M9901 Segmental and somatic dysfunction of cervical region: Secondary | ICD-10-CM | POA: Diagnosis not present

## 2019-06-12 DIAGNOSIS — M9902 Segmental and somatic dysfunction of thoracic region: Secondary | ICD-10-CM | POA: Diagnosis not present

## 2019-06-12 DIAGNOSIS — M9903 Segmental and somatic dysfunction of lumbar region: Secondary | ICD-10-CM | POA: Diagnosis not present

## 2019-06-12 DIAGNOSIS — M546 Pain in thoracic spine: Secondary | ICD-10-CM | POA: Diagnosis not present

## 2019-06-12 DIAGNOSIS — M531 Cervicobrachial syndrome: Secondary | ICD-10-CM | POA: Diagnosis not present

## 2019-06-12 DIAGNOSIS — M5441 Lumbago with sciatica, right side: Secondary | ICD-10-CM | POA: Diagnosis not present

## 2019-06-12 DIAGNOSIS — M9901 Segmental and somatic dysfunction of cervical region: Secondary | ICD-10-CM | POA: Diagnosis not present

## 2019-06-16 ENCOUNTER — Other Ambulatory Visit: Payer: Self-pay

## 2019-06-16 ENCOUNTER — Encounter: Payer: Self-pay | Admitting: Emergency Medicine

## 2019-06-16 ENCOUNTER — Ambulatory Visit
Admission: EM | Admit: 2019-06-16 | Discharge: 2019-06-16 | Disposition: A | Discharge status: Home / Self Care | Payer: PPO | Attending: Family Medicine | Admitting: Family Medicine

## 2019-06-16 DIAGNOSIS — H1032 Unspecified acute conjunctivitis, left eye: Secondary | ICD-10-CM | POA: Diagnosis not present

## 2019-06-16 MED ORDER — POLYMYXIN B-TRIMETHOPRIM 10000-0.1 UNIT/ML-% OP SOLN: 1.0000 [drp] | OPHTHALMIC | 0 refills | Status: AC

## 2019-06-16 NOTE — ED Provider Notes (Signed)
[facility]    CSN: [id number] Arrival date & time: 06/16/19  1241      History   Chief Complaint Chief Complaint  Patient presents with  . Eye Problem    HPI Ronnie Christensen is a 64 y.o. male.   [name] presents with complaints of left eye tearing and redness which he has noted for the past two days without improvement. Mild mattering this morning. Has not noted purulent drainage during the day. No pain or burning. No itching. Right eye feels WNL. No eye ball pain or pain with movement of the eye. No light sensitivity. No vision changes. Last week had allergy symptoms which have since improved. History of seasonal allergies. Has had "pink eye" in the past and he states this feels similar. No known exposures and no known chemical or foreign body exposures. Wears glasses, doesn't wear contacts.     ROS per HPI, negative if not otherwise mentioned.      Past Medical History:  Diagnosis Date  . Asthma   . Chronic pain     There are no problems to display for this patient.   Past Surgical History:  Procedure Laterality Date  . ANKLE SURGERY Left   . TEMPOROMANDIBULAR JOINT ARTHROPLASTY         Home Medications    Prior to Admission medications   Medication Sig Start Date End Date Taking? Authorizing Provider  tiZANidine (ZANAFLEX) 4 MG capsule Take 1 capsule (4 mg total) by mouth 3 (three) times daily as needed for muscle spasms. 08/09/18  Yes Conty, Linward Foster, MD  albuterol (PROAIR HFA) 108 (90 Base) MCG/ACT inhaler USE 2 PUFFS INTO THE LUNGS EVERY 6 HOURS AS NEEDED 06/22/14   [provider]  chlorpheniramine-HYDROcodone (TUSSIONEX PENNKINETIC ER) 10-8 MG/5ML SUER Take 5 mLs by mouth every 12 (twelve) hours as needed for cough. 06/16/17   Melynda Ripple, MD  ibuprofen (ADVIL,MOTRIN) 600 MG tablet Take 1 tablet (600 mg total) by mouth every 6 (six) hours as needed. 06/16/17   Melynda Ripple, MD  tiZANidine (ZANAFLEX) 4 MG tablet Take 4 mg by  mouth. 09/11/16   [provider]  trimethoprim-polymyxin b (POLYTRIM) ophthalmic solution Place 1 drop into the left eye every 4 (four) hours for 5 days. 06/16/19 06/21/19  Zigmund Gottron, NP    Family History Family History  Problem Relation Age of Onset  . Prostate cancer Mother   . Prostate cancer Father     Social History Social History   Tobacco Use  . Smoking status: Never Smoker  . Smokeless tobacco: Never Used  Substance Use Topics  . Alcohol use: Yes  . Drug use: Never     Allergies   Patient has no known allergies.   Review of Systems Review of Systems   Physical Exam Triage Vital Signs ED Triage Vitals  Enc Vitals Group     BP 06/16/19 1306 112/83     Pulse Rate 06/16/19 1306 79     Resp 06/16/19 1306 18     Temp 06/16/19 1306 98 F (36.7 C)     Temp Source 06/16/19 1306 Oral     SpO2 06/16/19 1306 99 %     Weight 06/16/19 1305 220 lb (99.8 kg)     Height 06/16/19 1305 6' (1.829 m)     Head Circumference --      Peak Flow --      Pain Score 06/16/19 1305 0     Pain Loc --  Pain Edu? --      Excl. in [place]? --    No data found.  Updated Vital Signs BP 112/83 (BP Location: Right Arm)   Pulse 79   Temp 98 F (36.7 C) (Oral)   Resp 18   Ht 6' (1.829 m)   Wt 220 lb (99.8 kg)   SpO2 99%   BMI 29.84 kg/m   Visual Acuity Right Eye Distance: 20/25 corrected Left Eye Distance: 20/25 corrected  Bilateral Distance: 20/20 corrected  Right Eye Near:   Left Eye Near:    Bilateral Near:     Physical Exam Constitutional:      Appearance: He is well-developed.  Eyes:     General: Lids are normal. Vision grossly intact. Gaze aligned appropriately.     Extraocular Movements: Extraocular movements intact.     Conjunctiva/sclera:     Left eye: Left conjunctiva is injected.     Pupils: Pupils are equal, round, and reactive to light.     Comments: Clear tearing noted; no pain with eye movement; no lid swelling   Cardiovascular:     Rate  and Rhythm: Normal rate.  Pulmonary:     Effort: Pulmonary effort is normal.  Skin:    General: Skin is warm and dry.  Neurological:     Mental Status: He is alert and oriented to person, place, and time.      UC Treatments / Results  Labs (all labs ordered are listed, but only abnormal results are displayed) Labs Reviewed - No data to display  EKG   Radiology No results found.  Procedures Procedures (including critical care time)  Medications Ordered in UC Medications - No data to display  Initial Impression / Assessment and Plan / UC Course  I have reviewed the triage vital signs and the nursing notes.  Pertinent labs & imaging results that were available during my care of the patient were reviewed by me and considered in my medical decision making (see chart for details).     No pain, no vision changes, no light sensitivity or foreign body sensation. fluorescein exam deferred today. Eye is red and tearing. Encouraged allergy treatment, with polytrim coverage provided as well. Eye doctor follow up precautions discussed. Patient verbalized understanding and agreeable to plan.   Final Clinical Impressions(s) / UC Diagnoses   Final diagnoses:  Acute conjunctivitis of left eye, unspecified acute conjunctivitis type     Discharge Instructions     As allergies can certainly lead to similar symptoms I would recommend allergy treatment as well- either oral allergy treatment or eye drops.  Course of antibiotic eye drop as prescribed.  Avoid rubbing the eye.  If worsening of symptoms please return or follow up with your eye doctor.    ED Prescriptions    Medication Sig Dispense Auth. Provider   trimethoprim-polymyxin b (POLYTRIM) ophthalmic solution Place 1 drop into the left eye every 4 (four) hours for 5 days. 10 mL Zigmund Gottron, NP     PDMP not reviewed this encounter.   Zigmund Gottron, NP 06/16/19 1324

## 2019-06-16 NOTE — ED Triage Notes (Signed)
Patient c/o left eye redness and eye wateing that started yesterday.

## 2019-06-16 NOTE — Discharge Instructions (Signed)
As allergies can certainly lead to similar symptoms I would recommend allergy treatment as well- either oral allergy treatment or eye drops.  Course of antibiotic eye drop as prescribed.  Avoid rubbing the eye.  If worsening of symptoms please return or follow up with your eye doctor.

## 2019-06-19 DIAGNOSIS — M5441 Lumbago with sciatica, right side: Secondary | ICD-10-CM | POA: Diagnosis not present

## 2019-06-19 DIAGNOSIS — M546 Pain in thoracic spine: Secondary | ICD-10-CM | POA: Diagnosis not present

## 2019-06-19 DIAGNOSIS — M9902 Segmental and somatic dysfunction of thoracic region: Secondary | ICD-10-CM | POA: Diagnosis not present

## 2019-06-19 DIAGNOSIS — M531 Cervicobrachial syndrome: Secondary | ICD-10-CM | POA: Diagnosis not present

## 2019-06-19 DIAGNOSIS — M9903 Segmental and somatic dysfunction of lumbar region: Secondary | ICD-10-CM | POA: Diagnosis not present

## 2019-06-19 DIAGNOSIS — M9901 Segmental and somatic dysfunction of cervical region: Secondary | ICD-10-CM | POA: Diagnosis not present

## 2019-06-24 DIAGNOSIS — M5136 Other intervertebral disc degeneration, lumbar region: Secondary | ICD-10-CM | POA: Diagnosis not present

## 2019-06-24 DIAGNOSIS — M5416 Radiculopathy, lumbar region: Secondary | ICD-10-CM | POA: Diagnosis not present

## 2019-06-27 DIAGNOSIS — M19072 Primary osteoarthritis, left ankle and foot: Secondary | ICD-10-CM | POA: Diagnosis not present

## 2019-06-27 DIAGNOSIS — S82832K Other fracture of upper and lower end of left fibula, subsequent encounter for closed fracture with nonunion: Secondary | ICD-10-CM | POA: Diagnosis not present

## 2019-06-27 DIAGNOSIS — M19079 Primary osteoarthritis, unspecified ankle and foot: Secondary | ICD-10-CM | POA: Diagnosis not present

## 2019-06-27 DIAGNOSIS — M19172 Post-traumatic osteoarthritis, left ankle and foot: Secondary | ICD-10-CM | POA: Diagnosis not present

## 2019-06-27 DIAGNOSIS — G8929 Other chronic pain: Secondary | ICD-10-CM | POA: Diagnosis not present

## 2019-06-27 DIAGNOSIS — S86892A Other injury of other muscle(s) and tendon(s) at lower leg level, left leg, initial encounter: Secondary | ICD-10-CM | POA: Diagnosis not present

## 2019-06-27 DIAGNOSIS — M25572 Pain in left ankle and joints of left foot: Secondary | ICD-10-CM | POA: Diagnosis not present

## 2019-07-03 DIAGNOSIS — M5441 Lumbago with sciatica, right side: Secondary | ICD-10-CM | POA: Diagnosis not present

## 2019-07-03 DIAGNOSIS — M531 Cervicobrachial syndrome: Secondary | ICD-10-CM | POA: Diagnosis not present

## 2019-07-03 DIAGNOSIS — M9901 Segmental and somatic dysfunction of cervical region: Secondary | ICD-10-CM | POA: Diagnosis not present

## 2019-07-03 DIAGNOSIS — M9903 Segmental and somatic dysfunction of lumbar region: Secondary | ICD-10-CM | POA: Diagnosis not present

## 2019-07-03 DIAGNOSIS — M9902 Segmental and somatic dysfunction of thoracic region: Secondary | ICD-10-CM | POA: Diagnosis not present

## 2019-07-03 DIAGNOSIS — M546 Pain in thoracic spine: Secondary | ICD-10-CM | POA: Diagnosis not present

## 2019-07-10 DIAGNOSIS — M9903 Segmental and somatic dysfunction of lumbar region: Secondary | ICD-10-CM | POA: Diagnosis not present

## 2019-07-10 DIAGNOSIS — M546 Pain in thoracic spine: Secondary | ICD-10-CM | POA: Diagnosis not present

## 2019-07-10 DIAGNOSIS — M9902 Segmental and somatic dysfunction of thoracic region: Secondary | ICD-10-CM | POA: Diagnosis not present

## 2019-07-10 DIAGNOSIS — M531 Cervicobrachial syndrome: Secondary | ICD-10-CM | POA: Diagnosis not present

## 2019-07-10 DIAGNOSIS — M9901 Segmental and somatic dysfunction of cervical region: Secondary | ICD-10-CM | POA: Diagnosis not present

## 2019-07-10 DIAGNOSIS — M5441 Lumbago with sciatica, right side: Secondary | ICD-10-CM | POA: Diagnosis not present

## 2019-07-17 DIAGNOSIS — M9901 Segmental and somatic dysfunction of cervical region: Secondary | ICD-10-CM | POA: Diagnosis not present

## 2019-07-17 DIAGNOSIS — M546 Pain in thoracic spine: Secondary | ICD-10-CM | POA: Diagnosis not present

## 2019-07-17 DIAGNOSIS — M531 Cervicobrachial syndrome: Secondary | ICD-10-CM | POA: Diagnosis not present

## 2019-07-17 DIAGNOSIS — M9903 Segmental and somatic dysfunction of lumbar region: Secondary | ICD-10-CM | POA: Diagnosis not present

## 2019-07-17 DIAGNOSIS — M5441 Lumbago with sciatica, right side: Secondary | ICD-10-CM | POA: Diagnosis not present

## 2019-07-17 DIAGNOSIS — M9902 Segmental and somatic dysfunction of thoracic region: Secondary | ICD-10-CM | POA: Diagnosis not present

## 2019-07-23 DIAGNOSIS — K0381 Cracked tooth: Secondary | ICD-10-CM | POA: Diagnosis not present

## 2019-07-24 DIAGNOSIS — M531 Cervicobrachial syndrome: Secondary | ICD-10-CM | POA: Diagnosis not present

## 2019-07-24 DIAGNOSIS — M5441 Lumbago with sciatica, right side: Secondary | ICD-10-CM | POA: Diagnosis not present

## 2019-07-24 DIAGNOSIS — M546 Pain in thoracic spine: Secondary | ICD-10-CM | POA: Diagnosis not present

## 2019-07-24 DIAGNOSIS — M9902 Segmental and somatic dysfunction of thoracic region: Secondary | ICD-10-CM | POA: Diagnosis not present

## 2019-07-24 DIAGNOSIS — M9901 Segmental and somatic dysfunction of cervical region: Secondary | ICD-10-CM | POA: Diagnosis not present

## 2019-07-24 DIAGNOSIS — M9903 Segmental and somatic dysfunction of lumbar region: Secondary | ICD-10-CM | POA: Diagnosis not present

## 2019-07-25 DIAGNOSIS — M5416 Radiculopathy, lumbar region: Secondary | ICD-10-CM | POA: Diagnosis not present

## 2019-07-25 DIAGNOSIS — M5136 Other intervertebral disc degeneration, lumbar region: Secondary | ICD-10-CM | POA: Diagnosis not present

## 2019-07-25 DIAGNOSIS — M62830 Muscle spasm of back: Secondary | ICD-10-CM | POA: Diagnosis not present

## 2019-08-07 DIAGNOSIS — M9902 Segmental and somatic dysfunction of thoracic region: Secondary | ICD-10-CM | POA: Diagnosis not present

## 2019-08-07 DIAGNOSIS — M9901 Segmental and somatic dysfunction of cervical region: Secondary | ICD-10-CM | POA: Diagnosis not present

## 2019-08-07 DIAGNOSIS — M546 Pain in thoracic spine: Secondary | ICD-10-CM | POA: Diagnosis not present

## 2019-08-07 DIAGNOSIS — M9903 Segmental and somatic dysfunction of lumbar region: Secondary | ICD-10-CM | POA: Diagnosis not present

## 2019-08-07 DIAGNOSIS — M5441 Lumbago with sciatica, right side: Secondary | ICD-10-CM | POA: Diagnosis not present

## 2019-08-07 DIAGNOSIS — M531 Cervicobrachial syndrome: Secondary | ICD-10-CM | POA: Diagnosis not present

## 2019-08-21 DIAGNOSIS — M546 Pain in thoracic spine: Secondary | ICD-10-CM | POA: Diagnosis not present

## 2019-08-21 DIAGNOSIS — M5441 Lumbago with sciatica, right side: Secondary | ICD-10-CM | POA: Diagnosis not present

## 2019-08-21 DIAGNOSIS — M9901 Segmental and somatic dysfunction of cervical region: Secondary | ICD-10-CM | POA: Diagnosis not present

## 2019-08-21 DIAGNOSIS — M9902 Segmental and somatic dysfunction of thoracic region: Secondary | ICD-10-CM | POA: Diagnosis not present

## 2019-08-21 DIAGNOSIS — M9903 Segmental and somatic dysfunction of lumbar region: Secondary | ICD-10-CM | POA: Diagnosis not present

## 2019-08-21 DIAGNOSIS — M531 Cervicobrachial syndrome: Secondary | ICD-10-CM | POA: Diagnosis not present

## 2019-09-15 DIAGNOSIS — M7061 Trochanteric bursitis, right hip: Secondary | ICD-10-CM | POA: Diagnosis not present

## 2019-09-15 DIAGNOSIS — M25572 Pain in left ankle and joints of left foot: Secondary | ICD-10-CM | POA: Diagnosis not present

## 2019-09-15 DIAGNOSIS — M5136 Other intervertebral disc degeneration, lumbar region: Secondary | ICD-10-CM | POA: Diagnosis not present

## 2019-09-15 DIAGNOSIS — M19172 Post-traumatic osteoarthritis, left ankle and foot: Secondary | ICD-10-CM | POA: Diagnosis not present

## 2019-09-15 DIAGNOSIS — M25552 Pain in left hip: Secondary | ICD-10-CM | POA: Diagnosis not present

## 2019-09-15 DIAGNOSIS — M47816 Spondylosis without myelopathy or radiculopathy, lumbar region: Secondary | ICD-10-CM | POA: Diagnosis not present

## 2019-09-15 DIAGNOSIS — G8929 Other chronic pain: Secondary | ICD-10-CM | POA: Diagnosis not present

## 2019-09-15 DIAGNOSIS — M25571 Pain in right ankle and joints of right foot: Secondary | ICD-10-CM | POA: Diagnosis not present

## 2019-09-15 DIAGNOSIS — M19171 Post-traumatic osteoarthritis, right ankle and foot: Secondary | ICD-10-CM | POA: Diagnosis not present

## 2019-09-15 DIAGNOSIS — M25551 Pain in right hip: Secondary | ICD-10-CM | POA: Diagnosis not present

## 2019-09-24 DIAGNOSIS — M19171 Post-traumatic osteoarthritis, right ankle and foot: Secondary | ICD-10-CM | POA: Diagnosis not present

## 2019-09-24 DIAGNOSIS — M25571 Pain in right ankle and joints of right foot: Secondary | ICD-10-CM | POA: Diagnosis not present

## 2019-09-24 DIAGNOSIS — G8929 Other chronic pain: Secondary | ICD-10-CM | POA: Diagnosis not present

## 2019-10-08 DIAGNOSIS — G8929 Other chronic pain: Secondary | ICD-10-CM | POA: Diagnosis not present

## 2019-10-08 DIAGNOSIS — M25571 Pain in right ankle and joints of right foot: Secondary | ICD-10-CM | POA: Diagnosis not present

## 2019-10-08 DIAGNOSIS — M19171 Post-traumatic osteoarthritis, right ankle and foot: Secondary | ICD-10-CM | POA: Diagnosis not present

## 2019-10-15 DIAGNOSIS — M25571 Pain in right ankle and joints of right foot: Secondary | ICD-10-CM | POA: Diagnosis not present

## 2019-10-15 DIAGNOSIS — M1611 Unilateral primary osteoarthritis, right hip: Secondary | ICD-10-CM | POA: Diagnosis not present

## 2019-10-15 DIAGNOSIS — M25551 Pain in right hip: Secondary | ICD-10-CM | POA: Diagnosis not present

## 2019-10-15 DIAGNOSIS — M19171 Post-traumatic osteoarthritis, right ankle and foot: Secondary | ICD-10-CM | POA: Diagnosis not present

## 2019-10-15 DIAGNOSIS — M25851 Other specified joint disorders, right hip: Secondary | ICD-10-CM | POA: Diagnosis not present

## 2019-10-15 DIAGNOSIS — G8929 Other chronic pain: Secondary | ICD-10-CM | POA: Diagnosis not present

## 2019-10-30 ENCOUNTER — Other Ambulatory Visit: Payer: Self-pay | Admitting: Sports Medicine

## 2019-10-30 DIAGNOSIS — M1611 Unilateral primary osteoarthritis, right hip: Secondary | ICD-10-CM | POA: Diagnosis not present

## 2019-10-30 DIAGNOSIS — M25851 Other specified joint disorders, right hip: Secondary | ICD-10-CM

## 2019-10-30 DIAGNOSIS — M7061 Trochanteric bursitis, right hip: Secondary | ICD-10-CM | POA: Diagnosis not present

## 2019-10-30 DIAGNOSIS — M25551 Pain in right hip: Secondary | ICD-10-CM

## 2019-10-30 DIAGNOSIS — M47816 Spondylosis without myelopathy or radiculopathy, lumbar region: Secondary | ICD-10-CM | POA: Diagnosis not present

## 2019-11-03 ENCOUNTER — Other Ambulatory Visit: Payer: Self-pay

## 2019-11-03 ENCOUNTER — Ambulatory Visit
Admission: RE | Admit: 2019-11-03 | Discharge: 2019-11-03 | Disposition: A | Discharge status: Home / Self Care | Payer: PPO | Source: Ambulatory Visit | Attending: Sports Medicine | Admitting: Sports Medicine

## 2019-11-03 DIAGNOSIS — M1611 Unilateral primary osteoarthritis, right hip: Secondary | ICD-10-CM | POA: Insufficient documentation

## 2019-11-03 DIAGNOSIS — M25551 Pain in right hip: Secondary | ICD-10-CM | POA: Insufficient documentation

## 2019-11-03 DIAGNOSIS — M25851 Other specified joint disorders, right hip: Secondary | ICD-10-CM | POA: Diagnosis not present

## 2019-11-03 IMAGING — MR MR HIP*R* W/O CM
5 series · 32 of 40 positions shown · non-contrast
Comparison: None.

CLINICAL DATA: Chronic right hip pain.  No recent injury.

EXAM:
MR OF THE RIGHT HIP WITHOUT CONTRAST
TECHNIQUE: Multiplanar, multisequence MR imaging was performed. No intravenous
contrast was administered.

[Series 2: T1 · coronal · right · 4.0mm · 0.47mm/px · 2 of 38 slices shown]
[im 1/38]
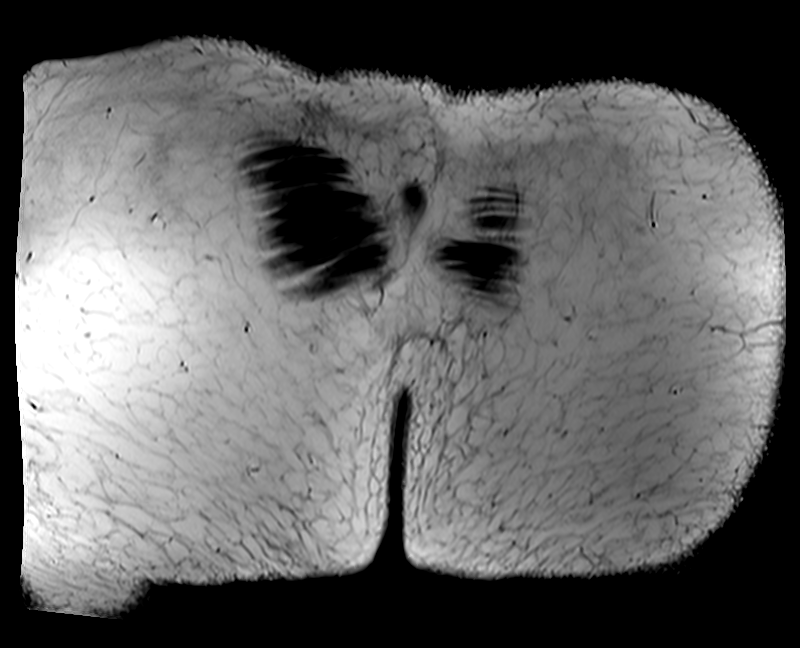
[im 5/38]
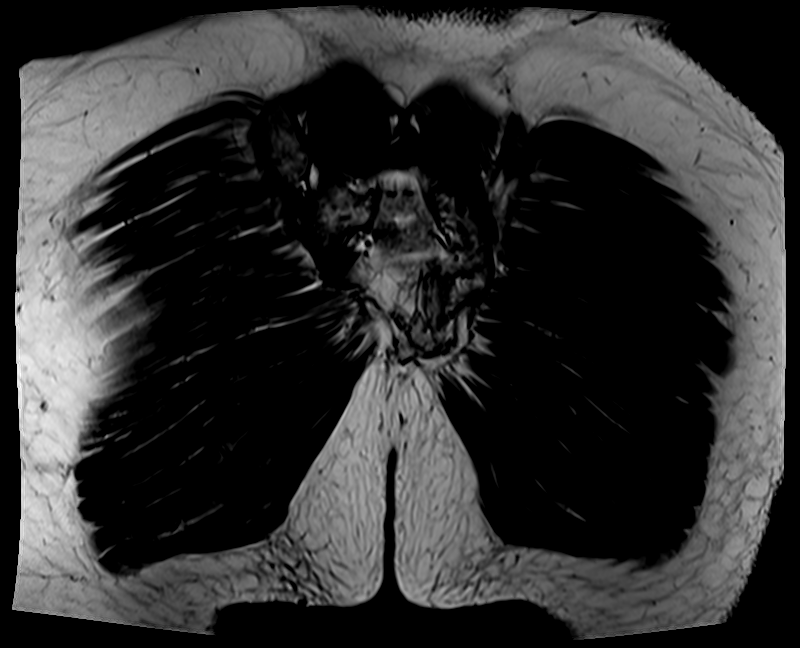

[Series 3: T2 fat-sat · coronal · right · 4.0mm · 1.19mm/px · 9 of 38 slices shown (1 of 2)]
[im 1/38]
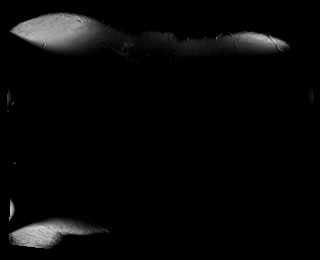
[im 5/38]
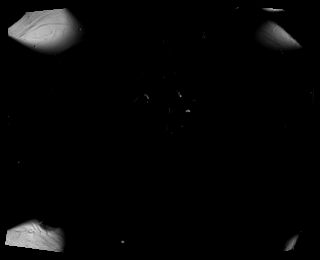
[im 10/38]
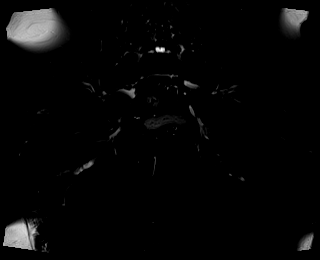
[im 14/38]
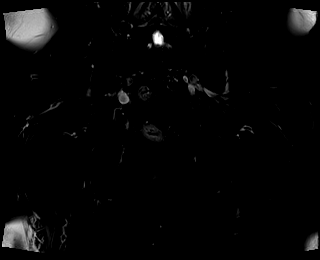
[im 19/38]
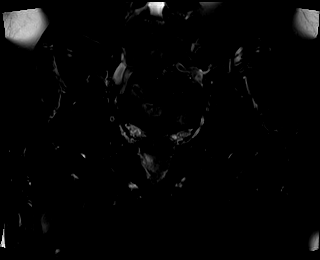
[im 24/38]
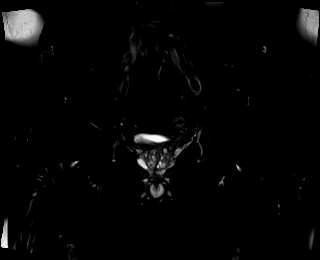
[im 28/38]
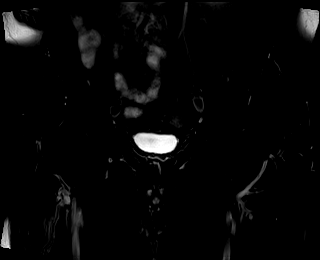
[im 33/38]
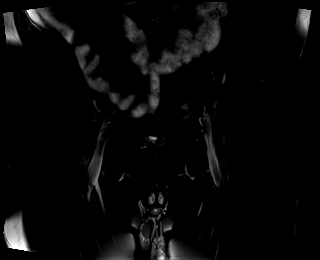
[im 38/38]
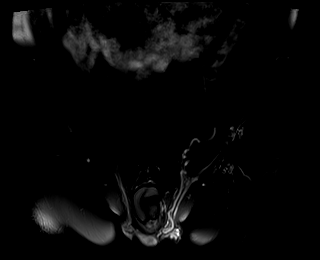

[Series 4: T2 fat-sat · axial · right · 4.0mm · 0.35mm/px · z∈[-78,+67]mm · 7 of 30 slices shown (2 of 2)]
[im 1/30]
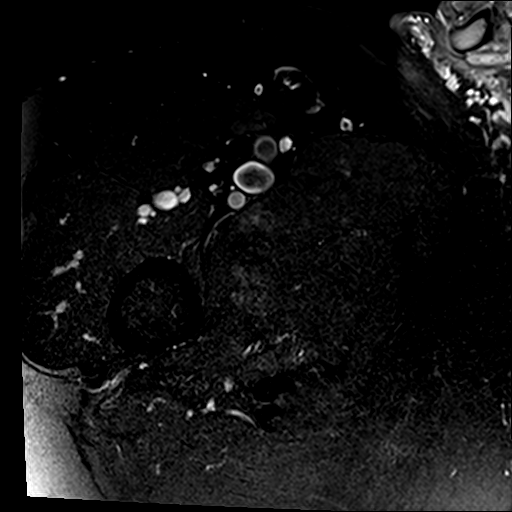
[im 5/30]
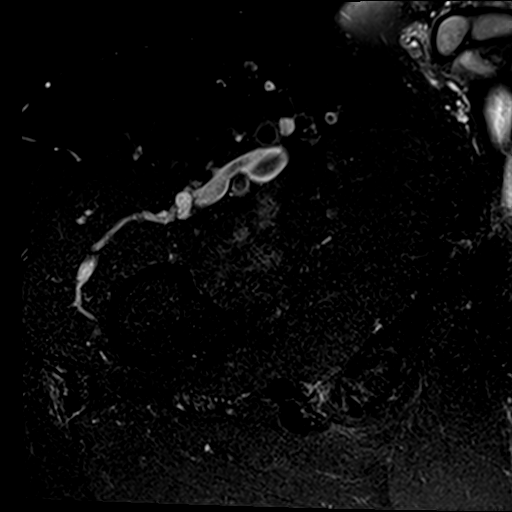
[im 10/30]
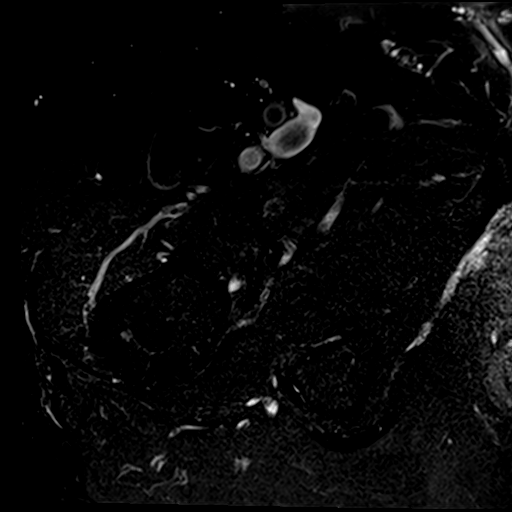
[im 15/30]
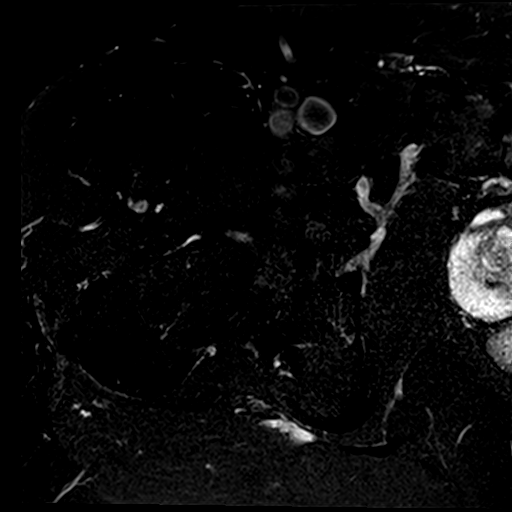
[im 20/30]
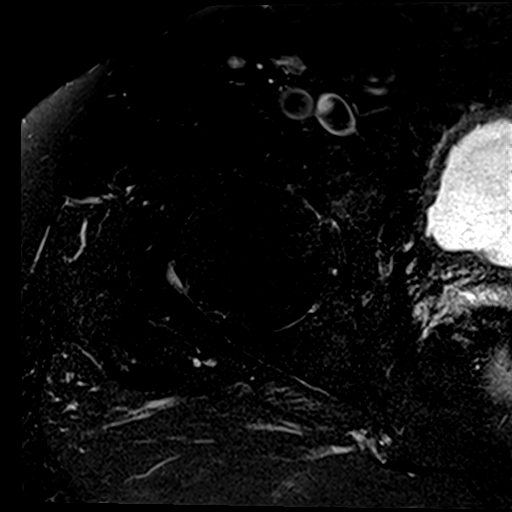
[im 25/30]
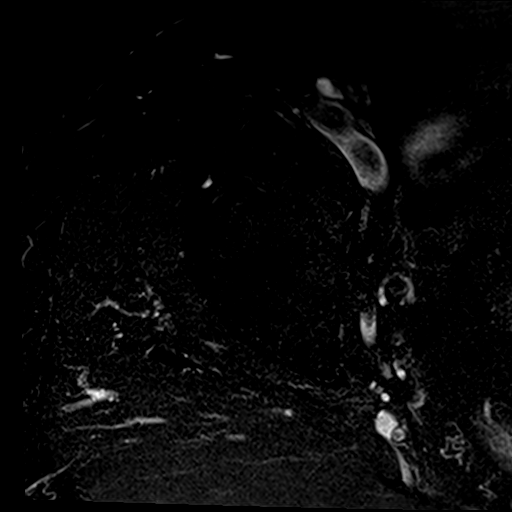
[im 30/30]
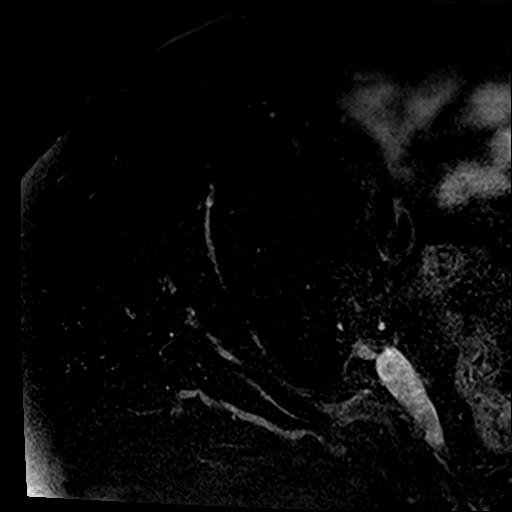

[Series 5: PD fat-sat · sagittal · right · 4.0mm · 0.70mm/px · 7 of 29 slices shown (1 of 2)]
[im 1/29]
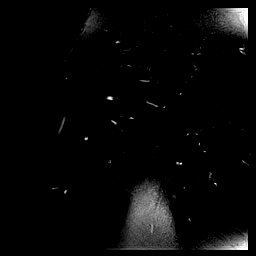
[im 5/29]
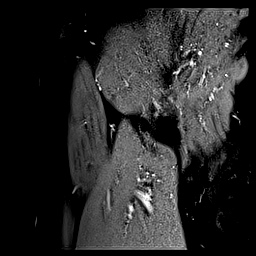
[im 10/29]
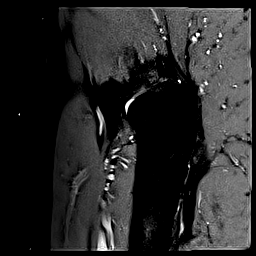
[im 15/29]
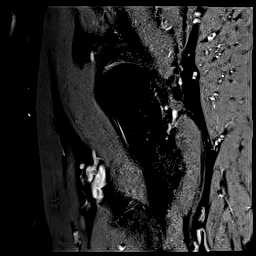
[im 19/29]
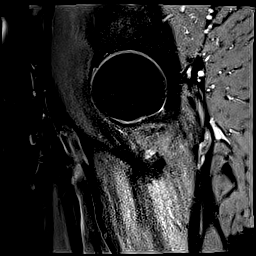
[im 24/29]
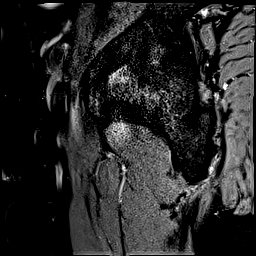
[im 29/29]
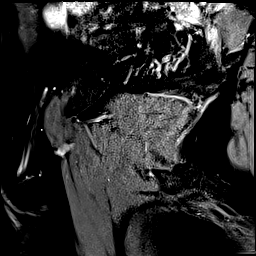

[Series 6: PD fat-sat · coronal · right · 4.0mm · 0.70mm/px · 7 of 29 slices shown (2 of 2)]
[im 1/29]
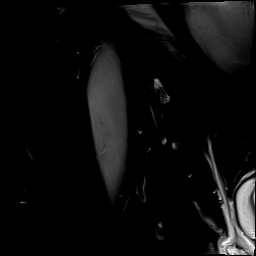
[im 5/29]
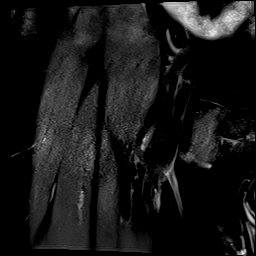
[im 10/29]
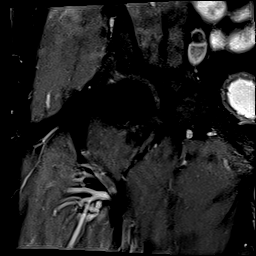
[im 15/29]
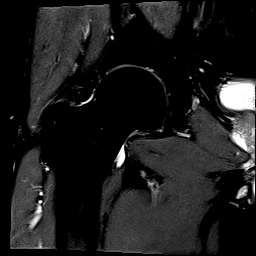
[im 19/29]
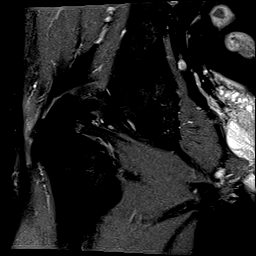
[im 24/29]
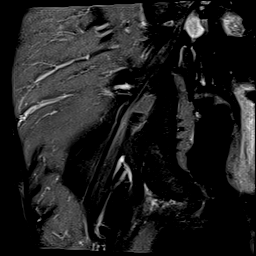
[im 29/29]
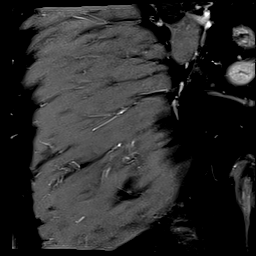

[32 of 40 positions shown; findings below may reference images not displayed]

FINDINGS: Bones: Marrow signal is normal throughout without fracture, stress
change or focal lesion. No avascular necrosis of the femoral heads.
No subchondral cyst formation or edema about the hips.

Articular cartilage and labrum

Articular cartilage:  Minimally degenerated.

Labrum:  The superior labrum is degenerated but no tear is seen.

Joint or bursal effusion

Joint effusion:  None.

Bursae: Negative.

Muscles and tendons

Muscles and tendons:  Normal.

Other findings

Miscellaneous: Imaged intrapelvic contents demonstrate mild
prostatomegaly but are otherwise unremarkable.
IMPRESSION: Minimal age related degenerative disease about the right hip. The
right hip is otherwise normal.

Prostatomegaly.

## 2019-11-04 DIAGNOSIS — M5416 Radiculopathy, lumbar region: Secondary | ICD-10-CM | POA: Diagnosis not present

## 2019-11-04 DIAGNOSIS — M5136 Other intervertebral disc degeneration, lumbar region: Secondary | ICD-10-CM | POA: Diagnosis not present

## 2019-12-04 DIAGNOSIS — M5136 Other intervertebral disc degeneration, lumbar region: Secondary | ICD-10-CM | POA: Diagnosis not present

## 2019-12-04 DIAGNOSIS — M5416 Radiculopathy, lumbar region: Secondary | ICD-10-CM | POA: Diagnosis not present

## 2019-12-24 DIAGNOSIS — M531 Cervicobrachial syndrome: Secondary | ICD-10-CM | POA: Diagnosis not present

## 2019-12-24 DIAGNOSIS — M5441 Lumbago with sciatica, right side: Secondary | ICD-10-CM | POA: Diagnosis not present

## 2019-12-24 DIAGNOSIS — M546 Pain in thoracic spine: Secondary | ICD-10-CM | POA: Diagnosis not present

## 2019-12-24 DIAGNOSIS — M9901 Segmental and somatic dysfunction of cervical region: Secondary | ICD-10-CM | POA: Diagnosis not present

## 2019-12-24 DIAGNOSIS — M9902 Segmental and somatic dysfunction of thoracic region: Secondary | ICD-10-CM | POA: Diagnosis not present

## 2019-12-24 DIAGNOSIS — M9903 Segmental and somatic dysfunction of lumbar region: Secondary | ICD-10-CM | POA: Diagnosis not present

## 2020-04-05 DIAGNOSIS — L570 Actinic keratosis: Secondary | ICD-10-CM | POA: Diagnosis not present

## 2020-04-05 DIAGNOSIS — L02212 Cutaneous abscess of back [any part, except buttock]: Secondary | ICD-10-CM | POA: Diagnosis not present

## 2020-05-20 DIAGNOSIS — Z0000 Encounter for general adult medical examination without abnormal findings: Secondary | ICD-10-CM | POA: Diagnosis not present

## 2020-06-07 DIAGNOSIS — M25571 Pain in right ankle and joints of right foot: Secondary | ICD-10-CM | POA: Diagnosis not present

## 2020-06-07 DIAGNOSIS — M19171 Post-traumatic osteoarthritis, right ankle and foot: Secondary | ICD-10-CM | POA: Diagnosis not present

## 2020-06-07 DIAGNOSIS — G8929 Other chronic pain: Secondary | ICD-10-CM | POA: Diagnosis not present

## 2020-06-14 DIAGNOSIS — G8929 Other chronic pain: Secondary | ICD-10-CM | POA: Diagnosis not present

## 2020-06-14 DIAGNOSIS — M19171 Post-traumatic osteoarthritis, right ankle and foot: Secondary | ICD-10-CM | POA: Diagnosis not present

## 2020-06-14 DIAGNOSIS — M25571 Pain in right ankle and joints of right foot: Secondary | ICD-10-CM | POA: Diagnosis not present

## 2020-06-21 DIAGNOSIS — M25571 Pain in right ankle and joints of right foot: Secondary | ICD-10-CM | POA: Diagnosis not present

## 2020-06-21 DIAGNOSIS — G8929 Other chronic pain: Secondary | ICD-10-CM | POA: Diagnosis not present

## 2020-06-21 DIAGNOSIS — M19171 Post-traumatic osteoarthritis, right ankle and foot: Secondary | ICD-10-CM | POA: Diagnosis not present

## 2020-07-16 DIAGNOSIS — H109 Unspecified conjunctivitis: Secondary | ICD-10-CM | POA: Diagnosis not present

## 2020-07-27 DIAGNOSIS — M542 Cervicalgia: Secondary | ICD-10-CM | POA: Diagnosis not present

## 2020-07-27 DIAGNOSIS — J4520 Mild intermittent asthma, uncomplicated: Secondary | ICD-10-CM | POA: Diagnosis not present

## 2020-10-14 DIAGNOSIS — M5416 Radiculopathy, lumbar region: Secondary | ICD-10-CM | POA: Diagnosis not present

## 2020-10-14 DIAGNOSIS — M5126 Other intervertebral disc displacement, lumbar region: Secondary | ICD-10-CM | POA: Diagnosis not present

## 2020-10-27 DIAGNOSIS — M9902 Segmental and somatic dysfunction of thoracic region: Secondary | ICD-10-CM | POA: Diagnosis not present

## 2020-10-27 DIAGNOSIS — M9903 Segmental and somatic dysfunction of lumbar region: Secondary | ICD-10-CM | POA: Diagnosis not present

## 2020-10-27 DIAGNOSIS — M5441 Lumbago with sciatica, right side: Secondary | ICD-10-CM | POA: Diagnosis not present

## 2020-10-27 DIAGNOSIS — M546 Pain in thoracic spine: Secondary | ICD-10-CM | POA: Diagnosis not present

## 2020-10-27 DIAGNOSIS — M531 Cervicobrachial syndrome: Secondary | ICD-10-CM | POA: Diagnosis not present

## 2020-10-27 DIAGNOSIS — M9901 Segmental and somatic dysfunction of cervical region: Secondary | ICD-10-CM | POA: Diagnosis not present

## 2020-11-03 DIAGNOSIS — M7062 Trochanteric bursitis, left hip: Secondary | ICD-10-CM | POA: Diagnosis not present

## 2020-11-03 DIAGNOSIS — M47816 Spondylosis without myelopathy or radiculopathy, lumbar region: Secondary | ICD-10-CM | POA: Diagnosis not present

## 2020-11-03 DIAGNOSIS — M5136 Other intervertebral disc degeneration, lumbar region: Secondary | ICD-10-CM | POA: Diagnosis not present

## 2020-11-03 DIAGNOSIS — M1611 Unilateral primary osteoarthritis, right hip: Secondary | ICD-10-CM | POA: Diagnosis not present

## 2020-11-03 DIAGNOSIS — M25552 Pain in left hip: Secondary | ICD-10-CM | POA: Diagnosis not present

## 2020-11-03 DIAGNOSIS — M25551 Pain in right hip: Secondary | ICD-10-CM | POA: Diagnosis not present

## 2020-11-03 DIAGNOSIS — M7061 Trochanteric bursitis, right hip: Secondary | ICD-10-CM | POA: Diagnosis not present

## 2020-11-03 DIAGNOSIS — M25851 Other specified joint disorders, right hip: Secondary | ICD-10-CM | POA: Diagnosis not present

## 2020-12-30 ENCOUNTER — Other Ambulatory Visit: Payer: Self-pay | Admitting: Physical Medicine and Rehabilitation

## 2020-12-30 ENCOUNTER — Other Ambulatory Visit: Payer: Self-pay

## 2020-12-30 DIAGNOSIS — M5416 Radiculopathy, lumbar region: Secondary | ICD-10-CM | POA: Diagnosis not present

## 2020-12-30 DIAGNOSIS — M5136 Other intervertebral disc degeneration, lumbar region: Secondary | ICD-10-CM | POA: Diagnosis not present

## 2020-12-30 DIAGNOSIS — M5126 Other intervertebral disc displacement, lumbar region: Secondary | ICD-10-CM | POA: Diagnosis not present

## 2021-01-04 ENCOUNTER — Other Ambulatory Visit: Payer: Self-pay

## 2021-01-04 ENCOUNTER — Ambulatory Visit
Admission: RE | Admit: 2021-01-04 | Discharge: 2021-01-04 | Disposition: A | Discharge status: Home / Self Care | Payer: PPO | Source: Ambulatory Visit | Attending: Physical Medicine and Rehabilitation | Admitting: Physical Medicine and Rehabilitation

## 2021-01-04 DIAGNOSIS — R200 Anesthesia of skin: Secondary | ICD-10-CM | POA: Diagnosis not present

## 2021-01-04 DIAGNOSIS — M5416 Radiculopathy, lumbar region: Secondary | ICD-10-CM | POA: Diagnosis not present

## 2021-01-04 DIAGNOSIS — M5136 Other intervertebral disc degeneration, lumbar region: Secondary | ICD-10-CM | POA: Diagnosis not present

## 2021-01-04 DIAGNOSIS — M5126 Other intervertebral disc displacement, lumbar region: Secondary | ICD-10-CM | POA: Diagnosis not present

## 2021-01-04 DIAGNOSIS — R531 Weakness: Secondary | ICD-10-CM | POA: Diagnosis not present

## 2021-01-04 IMAGING — MR MR LUMBAR SPINE W/O CM
5 series · 31 of 48 positions shown · non-contrast
Comparison: [DATE]

CLINICAL DATA: Leg weakness and numbness for 2-3 weeks. Recent
fall.

EXAM:
MRI LUMBAR SPINE WITHOUT CONTRAST
TECHNIQUE: Multiplanar, multisequence MR imaging of the lumbar spine was
performed. No intravenous contrast was administered.

[Series 5: T2 · sagittal · 4.0mm · 0.91mm/px · 6 of 17 slices shown (1 of 2)]
[im 1/17]
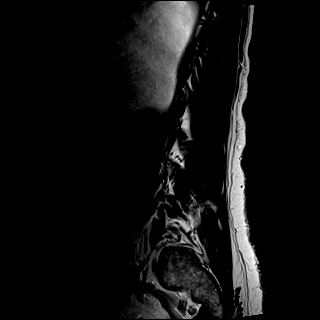
[im 4/17]
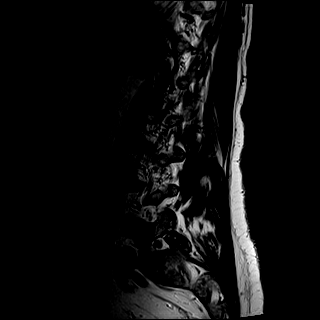
[im 7/17]
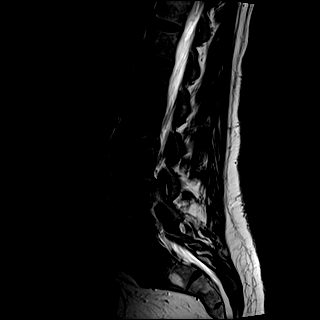
[im 10/17]
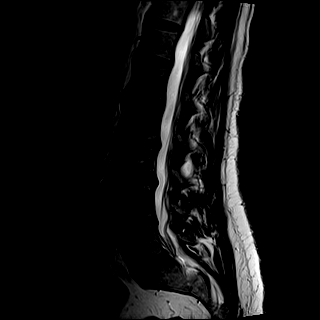
[im 13/17]
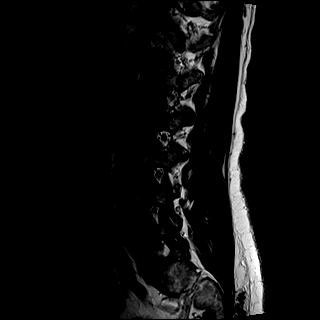
[im 17/17]
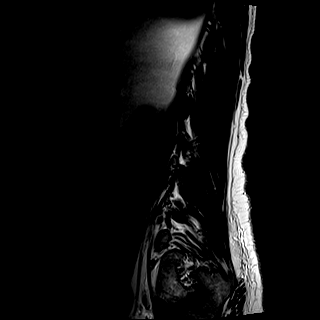

[Series 6: T1 · sagittal · 4.0mm · 0.91mm/px · 6 of 17 slices shown (1 of 2)]
[im 1/17]
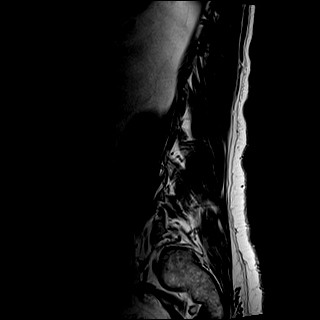
[im 4/17]
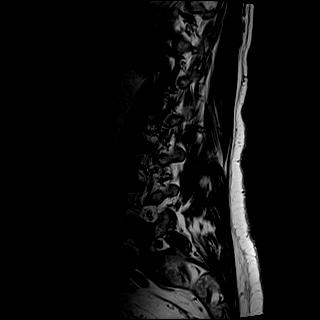
[im 7/17]
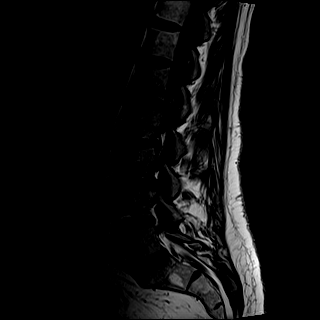
[im 10/17]
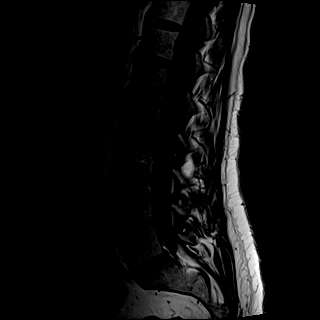
[im 13/17]
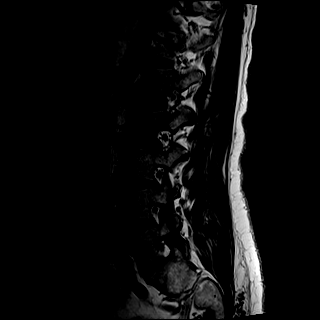
[im 17/17]
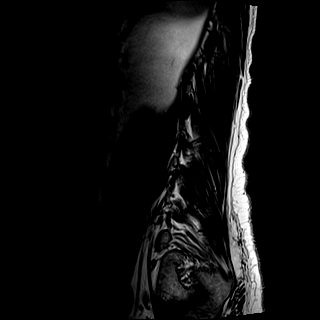

[Series 7: STIR · sagittal · 4.0mm · 0.45mm/px · 1 of 17 slices shown]
[im 1/17]
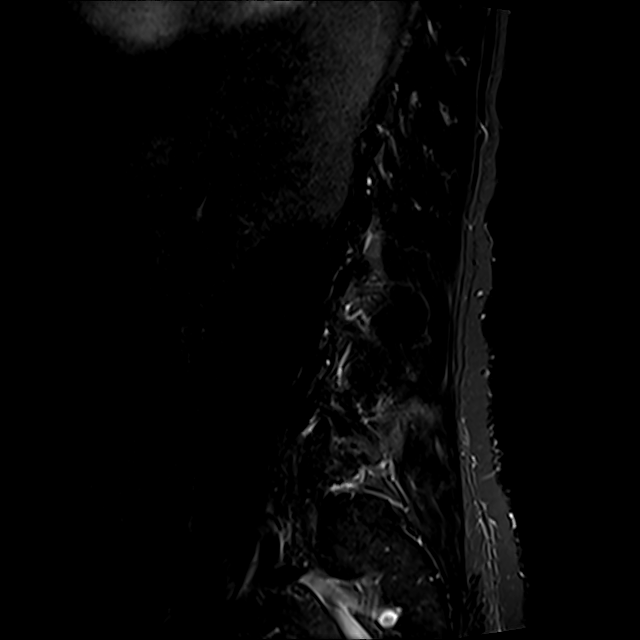

[Series 8: T2 · axial · 4.0mm · 0.78mm/px · z∈[-111,+140]mm · 9 of 41 slices shown (2 of 2)]
[im 1/41]
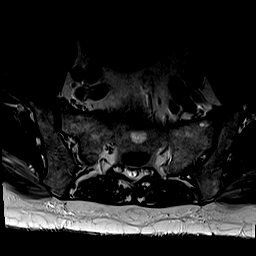
[im 6/41]
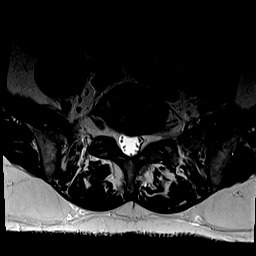
[im 12/41]
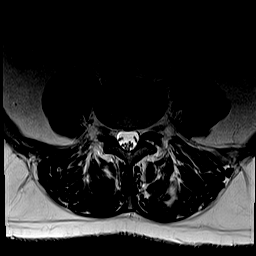
[im 18/41]
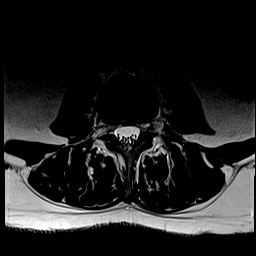
[im 21/41]
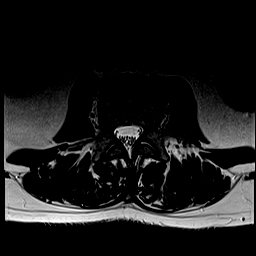
[im 23/41]
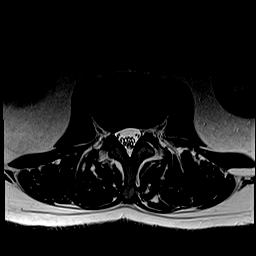
[im 29/41]
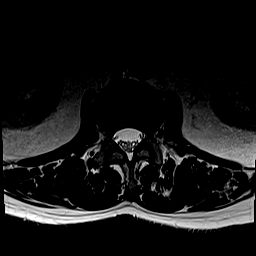
[im 35/41]
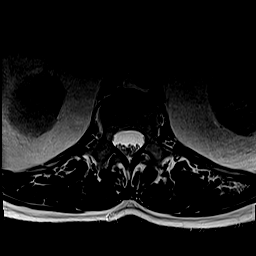
[im 41/41]
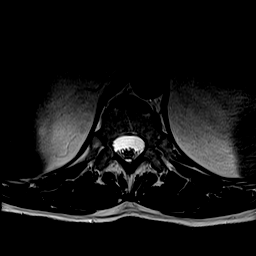

[Series 9: T1 · axial · 4.0mm · 0.39mm/px · z∈[-111,+140]mm · 9 of 41 slices shown (2 of 2)]
[im 1/41]
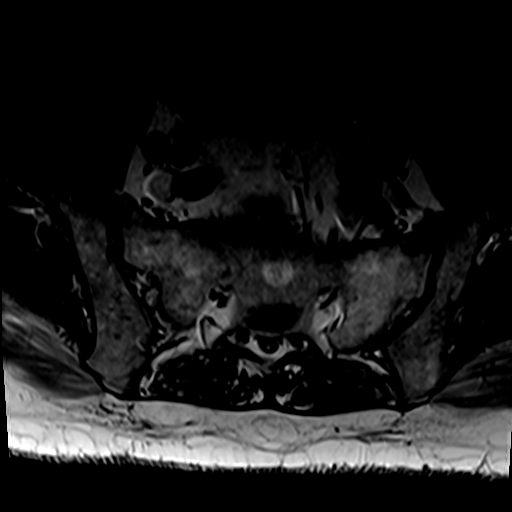
[im 6/41]
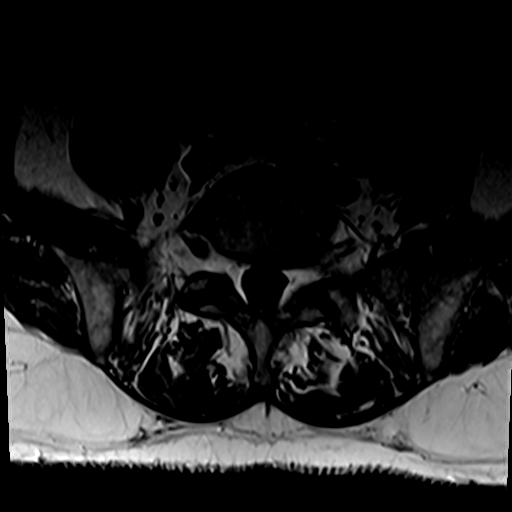
[im 12/41]
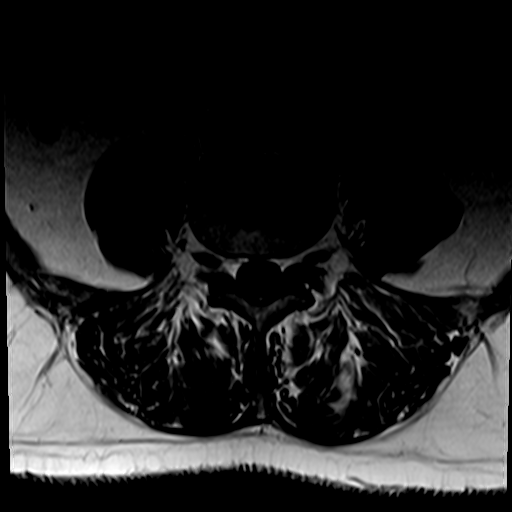
[im 18/41]
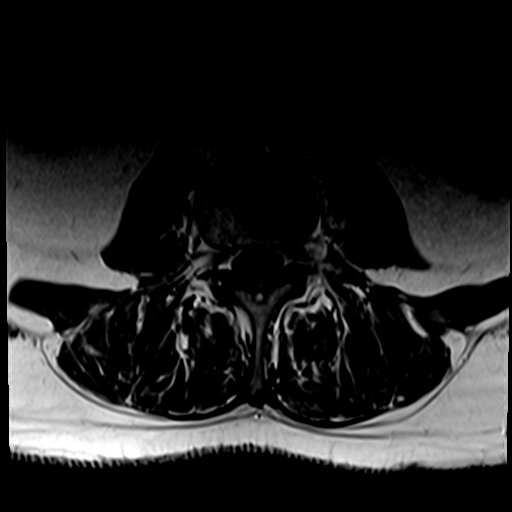
[im 21/41]
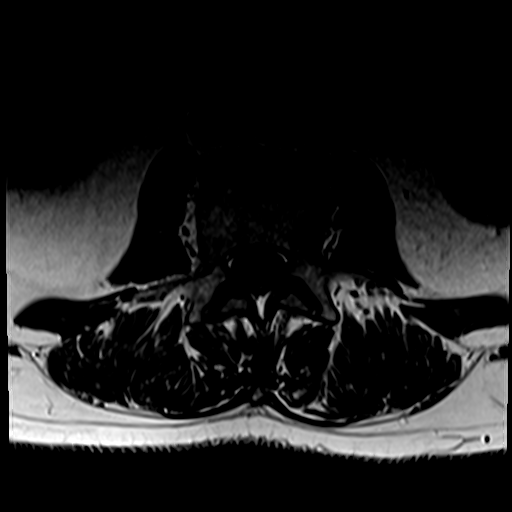
[im 23/41]
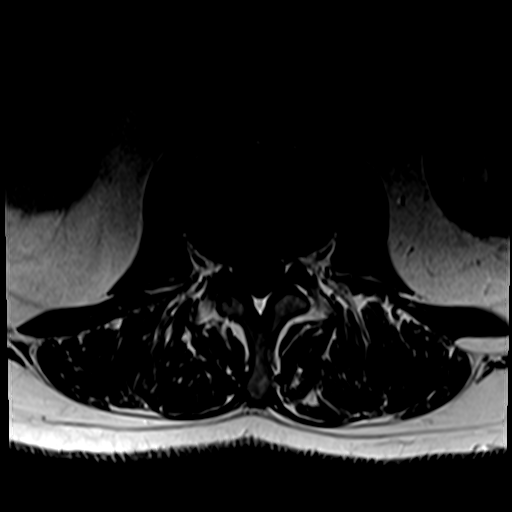
[im 29/41]
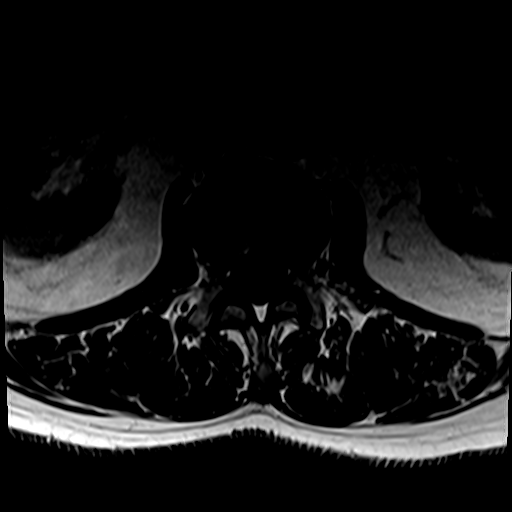
[im 35/41]
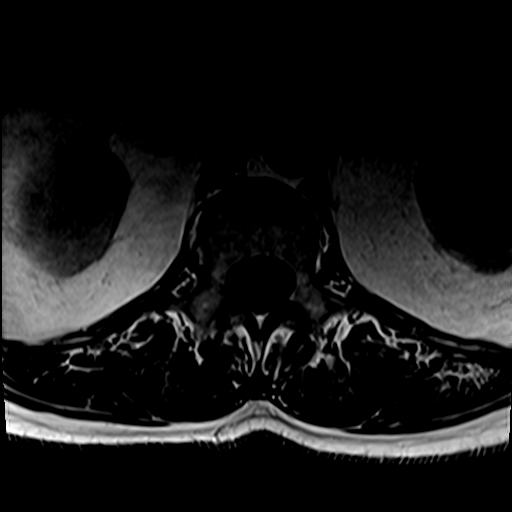
[im 41/41]
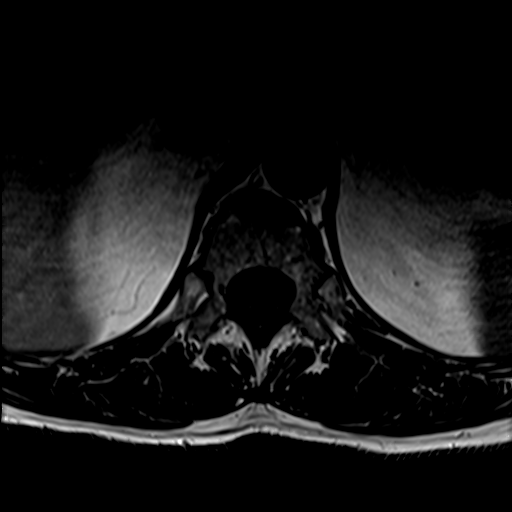

[31 of 48 positions shown; findings below may reference images not displayed]

FINDINGS: Segmentation:  Standard.

Alignment:  Physiologic.

Vertebrae:  No fracture, evidence of discitis, or bone lesion.

Conus medullaris and cauda equina: Conus extends to the L1 level.
Conus and cauda equina appear normal.

Paraspinal and other soft tissues: Normal

Disc levels:

L1-L2: Normal disc space and facet joints. No spinal canal stenosis.
No neural foraminal stenosis.

L2-L3: Small right asymmetric disc bulge, unchanged. No spinal canal
stenosis. No neural foraminal stenosis.

L3-L4: Small left asymmetric disc bulge, unchanged. Previously
present central disc protrusion has involuted. Normal facets. No
spinal canal stenosis. No neural foraminal stenosis.

L4-L5: Small disc bulge with mild facet hypertrophy. No spinal canal
stenosis. No neural foraminal stenosis.

L5-S1: Disc desiccation without herniation. No spinal canal
stenosis. No neural foraminal stenosis.

Visualized sacrum: Normal.
IMPRESSION: Mild multilevel degenerative disc disease without spinal canal or
neural foraminal stenosis.

## 2021-02-03 DIAGNOSIS — M5136 Other intervertebral disc degeneration, lumbar region: Secondary | ICD-10-CM | POA: Diagnosis not present

## 2021-02-03 DIAGNOSIS — M5126 Other intervertebral disc displacement, lumbar region: Secondary | ICD-10-CM | POA: Diagnosis not present

## 2021-02-03 DIAGNOSIS — M5416 Radiculopathy, lumbar region: Secondary | ICD-10-CM | POA: Diagnosis not present

## 2021-03-20 HISTORY — PX: OTHER SURGICAL HISTORY: SHX169

## 2021-09-14 ENCOUNTER — Other Ambulatory Visit: Payer: Self-pay | Admitting: *Deleted

## 2021-09-14 ENCOUNTER — Other Ambulatory Visit
Admission: RE | Admit: 2021-09-14 | Discharge: 2021-09-14 | Disposition: A | Discharge status: Home / Self Care | Payer: PPO | Attending: Urology | Admitting: Urology

## 2021-09-14 ENCOUNTER — Ambulatory Visit (INDEPENDENT_AMBULATORY_CARE_PROVIDER_SITE_OTHER): Payer: Self-pay | Admitting: Urology

## 2021-09-14 ENCOUNTER — Encounter: Payer: Self-pay | Admitting: Urology

## 2021-09-14 VITALS — BP 122/79 | HR 76 | Ht 72.0 in | Wt 224.0 lb

## 2021-09-14 DIAGNOSIS — N400 Enlarged prostate without lower urinary tract symptoms: Secondary | ICD-10-CM

## 2021-09-14 DIAGNOSIS — R350 Frequency of micturition: Secondary | ICD-10-CM

## 2021-09-14 DIAGNOSIS — N401 Enlarged prostate with lower urinary tract symptoms: Secondary | ICD-10-CM

## 2021-09-14 DIAGNOSIS — Z125 Encounter for screening for malignant neoplasm of prostate: Secondary | ICD-10-CM

## 2021-09-14 LAB — URINALYSIS, COMPLETE (UACMP) WITH MICROSCOPIC
Bacteria, UA: NONE SEEN
Bilirubin Urine: NEGATIVE
Glucose, UA: NEGATIVE mg/dL
Hgb urine dipstick: NEGATIVE
Ketones, ur: NEGATIVE mg/dL
Leukocytes,Ua: NEGATIVE
Nitrite: NEGATIVE
Protein, ur: NEGATIVE mg/dL
RBC / HPF: NONE SEEN RBC/hpf (ref 0–5)
Specific Gravity, Urine: 1.02 (ref 1.005–1.030)
Squamous Epithelial / HPF: NONE SEEN (ref 0–5)
WBC, UA: NONE SEEN WBC/hpf (ref 0–5)
pH: 7 (ref 5.0–8.0)

## 2021-09-14 LAB — BLADDER SCAN AMB NON-IMAGING

## 2021-09-14 NOTE — Patient Instructions (Signed)

## 2021-09-14 NOTE — Progress Notes (Signed)
   09/14/21 10:52 AM   Ronnie Christensen 08/31/1955 [id number]  CC: BPH, PSA screening  HPI: I saw Ronnie Christensen today for the above issues.  He is a healthy 66 year old male who was previously followed by Dr. Jacqlyn Larsen.  He reportedly has a history of a negative prostate biopsy.  He denies any significant urinary symptoms except for some mild urinary frequency, this seems to be worse when he has constipation.  PVR is normal today at 0 mL, and urinalysis is completely benign.  He really denies any significant urinary complaints and is satisfied with urinary symptoms at this time and not bothered enough to consider medications.  Most recent PSA was normal in February 2023 at 0.57.   PMH: Past Medical History:  Diagnosis Date   Asthma    Chronic pain     Surgical History: Past Surgical History:  Procedure Laterality Date   ANKLE SURGERY Left    TEMPOROMANDIBULAR JOINT ARTHROPLASTY        Family History: Family History  Problem Relation Age of Onset   Prostate cancer Mother    Prostate cancer Father     Social History:  reports that he has never smoked. He has never been exposed to tobacco smoke. He has never used smokeless tobacco. He reports current alcohol use. He reports that he does not use drugs.  Physical Exam: BP 122/79   Pulse 76   Ht 6' (1.829 m)   Wt 224 lb (101.6 kg)   BMI 30.38 kg/m    Constitutional:  Alert and oriented, No acute distress. Cardiovascular: No clubbing, cyanosis, or edema. Respiratory: Normal respiratory effort, no increased work of breathing. GI: Abdomen is soft, nontender, nondistended, no abdominal masses  Laboratory Data: Reviewed, see HPI  Assessment & Plan:   66 year old male with history of negative prostate biopsy, normal PSA of 0.57, and mild urinary symptoms of frequency that is minimally bothersome.  We reviewed the AUA guidelines regarding PSA screening, and he would like to continue screening every 1 to 2 years with his PCP.  We  discussed return precautions, as well as behavioral strategies regarding his mild urinary frequency.  Continue PSA screening through PCP, he prefers follow-up as needed with urology   Ronnie Madrid, MD 09/14/2021  [facility] [address] [place], [place] [address] [phone]

## 2021-10-19 ENCOUNTER — Other Ambulatory Visit: Payer: Self-pay | Admitting: Orthopedic Surgery

## 2021-10-19 DIAGNOSIS — M25551 Pain in right hip: Secondary | ICD-10-CM

## 2021-10-19 DIAGNOSIS — M25552 Pain in left hip: Secondary | ICD-10-CM

## 2021-11-02 ENCOUNTER — Ambulatory Visit
Admission: RE | Admit: 2021-11-02 | Discharge: 2021-11-02 | Disposition: A | Discharge status: Home / Self Care | Payer: PPO | Source: Ambulatory Visit | Attending: Orthopedic Surgery | Admitting: Orthopedic Surgery

## 2021-11-02 DIAGNOSIS — M25552 Pain in left hip: Secondary | ICD-10-CM

## 2021-11-02 DIAGNOSIS — M25551 Pain in right hip: Secondary | ICD-10-CM

## 2021-11-09 ENCOUNTER — Other Ambulatory Visit: Payer: Self-pay | Admitting: Orthopedic Surgery

## 2021-11-11 ENCOUNTER — Encounter: Payer: Self-pay | Admitting: Orthopedic Surgery

## 2021-11-11 ENCOUNTER — Encounter
Admission: RE | Admit: 2021-11-11 | Discharge: 2021-11-11 | Disposition: A | Discharge status: Home / Self Care | Payer: PPO | Source: Ambulatory Visit | Attending: Orthopedic Surgery | Admitting: Orthopedic Surgery

## 2021-11-11 ENCOUNTER — Other Ambulatory Visit: Payer: Self-pay

## 2021-11-11 VITALS — Ht 73.0 in | Wt 225.0 lb

## 2021-11-11 DIAGNOSIS — Z01810 Encounter for preprocedural cardiovascular examination: Secondary | ICD-10-CM

## 2021-11-11 NOTE — Patient Instructions (Addendum)
Your procedure is scheduled NI:OEVOJJ 28, 2023 MONDAY Report to the Registration Desk on the 1st floor of the [facility]. To find out your arrival time, please call [phone] between [place] on: FRIDAY November 11, 2021 If your arrival time is 6:00 am, do not arrive prior to that time as the [facility] entrance doors do not open until 6:00 am.  REMEMBER: Instructions that are not followed completely may result in serious medical risk, up to and including death; or upon the discretion of your surgeon and anesthesiologist your surgery may need to be rescheduled.  Do not eat food after midnight the night before surgery.  No gum chewing, lozengers or hard candies.  You may however, drink CLEAR liquids up to 2 hours before you are scheduled to arrive for your surgery. Do not drink anything within 2 hours of your scheduled arrival time.  Clear liquids include: - water  - apple juice without pulp - gatorade (not RED colors) - black coffee or tea (Do NOT add milk or creamers to the coffee or tea) Do NOT drink anything that is not on this list.  In addition, your doctor has ordered for you to drink the provided  Ensure Pre-Surgery Clear Carbohydrate Drink  Drinking this carbohydrate drink up to two hours before surgery helps to reduce insulin resistance and improve patient outcomes. Please complete drinking 2 hours prior to scheduled arrival time.  TAKE THESE MEDICATIONS THE MORNING OF SURGERY WITH A SIP OF WATER: NONE  Use inhalers on the day of surgery   One week prior to surgery: Stop Anti-inflammatories (NSAIDS) such as Advil, Aleve, Ibuprofen, Motrin, Naproxen, Naprosyn and Aspirin based products such as Excedrin, Goodys Powder, BC Powder. Stop ANY OVER THE COUNTER supplements until after surgery. You may however, continue to take Tylenol if needed for pain up until the day of surgery.  No Alcohol for 24 hours before or after surgery.  No Smoking including e-cigarettes for 24  hours prior to surgery.  No chewable tobacco products for at least 6 hours prior to surgery.  No nicotine patches on the day of surgery.  Do not use any "recreational" drugs for at least a week prior to your surgery.  Please be advised that the combination of cocaine and anesthesia may have negative outcomes, up to and including death. If you test positive for cocaine, your surgery will be cancelled.  On the morning of surgery brush your teeth with toothpaste and water, you may rinse your mouth with mouthwash if you wish. Do not swallow any toothpaste or mouthwash.  Use CHG Soap as directed on instruction sheet.  Do not wear jewelry, make-up, hairpins, clips or nail polish.  Do not wear lotions, powders, or perfumes OR DEODORANT   Do not shave body from the neck down 48 hours prior to surgery just in case you cut yourself which could leave a site for infection.  Also, freshly shaved skin may become irritated if using the CHG soap.  Contact lenses, hearing aids and dentures may not be worn into surgery.  Do not bring valuables to the hospital. [facility] is not responsible for any missing/lost belongings or valuables.   Notify your doctor if there is any change in your medical condition (cold, fever, infection).  Wear comfortable clothing (specific to your surgery type) to the hospital.  After surgery, you can help prevent lung complications by doing breathing exercises.  Take deep breaths and cough every 1-2 hours. Your doctor may order  a device called an Incentive Spirometer to help you take deep breaths. When coughing or sneezing, hold a pillow firmly against your incision with both hands. This is called "splinting." Doing this helps protect your incision. It also decreases belly discomfort.  If you are being discharged the day of surgery, you will not be allowed to drive home. You will need a responsible adult (18 years or older) to drive you home and stay with you that night.    If you are taking public transportation, you will need to have a responsible adult (18 years or older) with you. Please confirm with your physician that it is acceptable to use public transportation.   Please call the [facility] Dept. at [phone] if you have any questions about these instructions.  Surgery Visitation Policy:  Patients undergoing a surgery or procedure may have two family members or support persons with them as long as the person is not COVID-19 positive or experiencing its symptoms.

## 2021-11-14 ENCOUNTER — Ambulatory Visit: Payer: PPO | Admitting: Anesthesiology

## 2021-11-14 ENCOUNTER — Encounter
Admission: RE | Disposition: A | Discharge status: Home / Self Care | Payer: Self-pay | Source: Home / Self Care | Attending: Orthopedic Surgery

## 2021-11-14 ENCOUNTER — Ambulatory Visit: Payer: PPO

## 2021-11-14 ENCOUNTER — Encounter: Payer: Self-pay | Admitting: Orthopedic Surgery

## 2021-11-14 ENCOUNTER — Other Ambulatory Visit: Payer: Self-pay

## 2021-11-14 ENCOUNTER — Ambulatory Visit
Admission: RE | Admit: 2021-11-14 | Discharge: 2021-11-14 | Disposition: A | Discharge status: Home / Self Care | Payer: PPO | Attending: Orthopedic Surgery | Admitting: Orthopedic Surgery

## 2021-11-14 DIAGNOSIS — M7062 Trochanteric bursitis, left hip: Secondary | ICD-10-CM | POA: Diagnosis not present

## 2021-11-14 DIAGNOSIS — Z87891 Personal history of nicotine dependence: Secondary | ICD-10-CM | POA: Diagnosis not present

## 2021-11-14 DIAGNOSIS — M1990 Unspecified osteoarthritis, unspecified site: Secondary | ICD-10-CM | POA: Diagnosis not present

## 2021-11-14 DIAGNOSIS — J45909 Unspecified asthma, uncomplicated: Secondary | ICD-10-CM | POA: Diagnosis not present

## 2021-11-14 DIAGNOSIS — M62452 Contracture of muscle, left thigh: Secondary | ICD-10-CM | POA: Insufficient documentation

## 2021-11-14 HISTORY — DX: Benign prostatic hyperplasia without lower urinary tract symptoms: N40.0

## 2021-11-14 SURGERY — ARTHROSCOPIC TROCHANTERIC BURSECTOMY
Anesthesia: General | Site: Hip | Laterality: Left

## 2021-11-14 MED ORDER — DEXAMETHASONE SODIUM PHOSPHATE 10 MG/ML IJ SOLN
INTRAMUSCULAR | Status: AC
  Filled 2021-11-14: qty 1

## 2021-11-14 MED ORDER — PHENYLEPHRINE HCL (PRESSORS) 10 MG/ML IV SOLN
INTRAVENOUS | Status: DC | PRN
  Administered 2021-11-14: 160 ug via INTRAVENOUS
  Administered 2021-11-14 (×3): 80 ug via INTRAVENOUS

## 2021-11-14 MED ORDER — ACETAMINOPHEN 10 MG/ML IV SOLN: 1000.0000 mg | Freq: Once | INTRAVENOUS | Status: DC | PRN

## 2021-11-14 MED ORDER — DEXAMETHASONE SODIUM PHOSPHATE 10 MG/ML IJ SOLN
INTRAMUSCULAR | Status: DC | PRN
  Administered 2021-11-14: 10 mg via INTRAVENOUS

## 2021-11-14 MED ORDER — CHLORHEXIDINE GLUCONATE 0.12 % MT SOLN
OROMUCOSAL | Status: AC
  Administered 2021-11-14: 15 mL via OROMUCOSAL
  Filled 2021-11-14: qty 15

## 2021-11-14 MED ORDER — ACETAMINOPHEN 10 MG/ML IV SOLN
INTRAVENOUS | Status: DC | PRN
  Administered 2021-11-14: 1000 mg via INTRAVENOUS

## 2021-11-14 MED ORDER — ONDANSETRON HCL 4 MG/2ML IJ SOLN
4.0000 mg | Freq: Once | INTRAMUSCULAR | Status: DC | PRN
Start: 2021-11-14 — End: 2021-11-14

## 2021-11-14 MED ORDER — FENTANYL CITRATE (PF) 100 MCG/2ML IJ SOLN
INTRAMUSCULAR | Status: AC
  Administered 2021-11-14: 25 ug via INTRAVENOUS
  Filled 2021-11-14: qty 2

## 2021-11-14 MED ORDER — ASPIRIN 325 MG PO TBEC: 325.0000 mg | DELAYED_RELEASE_TABLET | Freq: Every day | ORAL | 0 refills | Status: AC

## 2021-11-14 MED ORDER — ACETAMINOPHEN 500 MG PO TABS: 1000.0000 mg | ORAL_TABLET | Freq: Three times a day (TID) | ORAL | 2 refills | Status: DC

## 2021-11-14 MED ORDER — OXYCODONE HCL 5 MG/5ML PO SOLN: 5.0000 mg | Freq: Once | ORAL | Status: AC | PRN

## 2021-11-14 MED ORDER — CHLORHEXIDINE GLUCONATE 0.12 % MT SOLN: 15.0000 mL | Freq: Once | OROMUCOSAL | Status: AC

## 2021-11-14 MED ORDER — KETOROLAC TROMETHAMINE 30 MG/ML IJ SOLN
INTRAMUSCULAR | Status: DC | PRN
  Administered 2021-11-14: 15 mg via INTRAVENOUS

## 2021-11-14 MED ORDER — PHENYLEPHRINE 80 MCG/ML (10ML) SYRINGE FOR IV PUSH (FOR BLOOD PRESSURE SUPPORT)
PREFILLED_SYRINGE | INTRAVENOUS | Status: AC
  Filled 2021-11-14: qty 10

## 2021-11-14 MED ORDER — LIDOCAINE HCL (CARDIAC) PF 100 MG/5ML IV SOSY
PREFILLED_SYRINGE | INTRAVENOUS | Status: DC | PRN
  Administered 2021-11-14: 100 mg via INTRAVENOUS

## 2021-11-14 MED ORDER — NAPROXEN 500 MG PO TABS: 500.0000 mg | ORAL_TABLET | Freq: Two times a day (BID) | ORAL | 0 refills | Status: DC

## 2021-11-14 MED ORDER — NAPROXEN 500 MG PO TBEC: 500.0000 mg | DELAYED_RELEASE_TABLET | Freq: Two times a day (BID) | ORAL | 2 refills | Status: DC

## 2021-11-14 MED ORDER — FENTANYL CITRATE (PF) 100 MCG/2ML IJ SOLN
INTRAMUSCULAR | Status: DC | PRN
  Administered 2021-11-14 (×2): 50 ug via INTRAVENOUS

## 2021-11-14 MED ORDER — OXYCODONE HCL 5 MG PO TABS
ORAL_TABLET | ORAL | Status: AC
  Administered 2021-11-14: 5 mg via ORAL
  Filled 2021-11-14: qty 1

## 2021-11-14 MED ORDER — ACETAMINOPHEN 10 MG/ML IV SOLN
INTRAVENOUS | Status: AC
  Filled 2021-11-14: qty 100

## 2021-11-14 MED ORDER — LACTATED RINGERS IR SOLN
Status: DC | PRN
  Administered 2021-11-14 (×5): 3000 mL

## 2021-11-14 MED ORDER — MIDAZOLAM HCL 2 MG/2ML IJ SOLN
INTRAMUSCULAR | Status: DC | PRN
  Administered 2021-11-14: 2 mg via INTRAVENOUS

## 2021-11-14 MED ORDER — EPHEDRINE 5 MG/ML INJ
INTRAVENOUS | Status: AC
  Filled 2021-11-14: qty 5

## 2021-11-14 MED ORDER — BUPIVACAINE HCL (PF) 0.5 % IJ SOLN
INTRAMUSCULAR | Status: AC
  Filled 2021-11-14: qty 30

## 2021-11-14 MED ORDER — EPHEDRINE SULFATE (PRESSORS) 50 MG/ML IJ SOLN
INTRAMUSCULAR | Status: DC | PRN
  Administered 2021-11-14 (×2): 10 mg via INTRAVENOUS

## 2021-11-14 MED ORDER — LIDOCAINE HCL (PF) 2 % IJ SOLN
INTRAMUSCULAR | Status: AC
  Filled 2021-11-14: qty 5

## 2021-11-14 MED ORDER — ONDANSETRON 4 MG PO TBDP: 4.0000 mg | ORAL_TABLET | Freq: Three times a day (TID) | ORAL | 0 refills | Status: DC | PRN

## 2021-11-14 MED ORDER — HYDROCODONE-ACETAMINOPHEN 5-325 MG PO TABS: 1.0000 | ORAL_TABLET | ORAL | 0 refills | Status: DC | PRN

## 2021-11-14 MED ORDER — FENTANYL CITRATE (PF) 100 MCG/2ML IJ SOLN
INTRAMUSCULAR | Status: AC
  Filled 2021-11-14: qty 2

## 2021-11-14 MED ORDER — ONDANSETRON HCL 4 MG/2ML IJ SOLN
INTRAMUSCULAR | Status: AC
  Filled 2021-11-14: qty 2

## 2021-11-14 MED ORDER — ORAL CARE MOUTH RINSE: 15.0000 mL | Freq: Once | OROMUCOSAL | Status: AC

## 2021-11-14 MED ORDER — LIDOCAINE-EPINEPHRINE 1 %-1:100000 IJ SOLN
INTRAMUSCULAR | Status: DC | PRN
  Administered 2021-11-14: 15 mL

## 2021-11-14 MED ORDER — KETOROLAC TROMETHAMINE 30 MG/ML IJ SOLN
INTRAMUSCULAR | Status: AC
  Filled 2021-11-14: qty 1

## 2021-11-14 MED ORDER — CEFAZOLIN SODIUM-DEXTROSE 2-4 GM/100ML-% IV SOLN
INTRAVENOUS | Status: AC
  Filled 2021-11-14: qty 100

## 2021-11-14 MED ORDER — FAMOTIDINE 20 MG PO TABS: 20.0000 mg | ORAL_TABLET | Freq: Once | ORAL | Status: AC

## 2021-11-14 MED ORDER — ONDANSETRON HCL 4 MG/2ML IJ SOLN
INTRAMUSCULAR | Status: DC | PRN
  Administered 2021-11-14: 4 mg via INTRAVENOUS

## 2021-11-14 MED ORDER — CEFAZOLIN SODIUM-DEXTROSE 2-4 GM/100ML-% IV SOLN
2.0000 g | INTRAVENOUS | Status: AC
  Administered 2021-11-14: 2 g via INTRAVENOUS

## 2021-11-14 MED ORDER — FENTANYL CITRATE (PF) 100 MCG/2ML IJ SOLN
25.0000 ug | INTRAMUSCULAR | Status: DC | PRN
  Administered 2021-11-14: 25 ug via INTRAVENOUS

## 2021-11-14 MED ORDER — FAMOTIDINE 20 MG PO TABS
ORAL_TABLET | ORAL | Status: AC
  Administered 2021-11-14: 20 mg via ORAL
  Filled 2021-11-14: qty 1

## 2021-11-14 MED ORDER — PROPOFOL 10 MG/ML IV BOLUS
INTRAVENOUS | Status: DC | PRN
  Administered 2021-11-14: 150 mg via INTRAVENOUS
  Administered 2021-11-14: 30 mg via INTRAVENOUS

## 2021-11-14 MED ORDER — OXYCODONE HCL 5 MG PO TABS: 5.0000 mg | ORAL_TABLET | Freq: Once | ORAL | Status: AC | PRN

## 2021-11-14 MED ORDER — MIDAZOLAM HCL 2 MG/2ML IJ SOLN
INTRAMUSCULAR | Status: AC
  Filled 2021-11-14: qty 2

## 2021-11-14 MED ORDER — LACTATED RINGERS IV SOLN: INTRAVENOUS | Status: DC

## 2021-11-14 SURGICAL SUPPLY — 55 items
ADAPTER IRRIG TUBE 2 SPIKE SOL (ADAPTER) ×2 IMPLANT
ADPR TBG 2 SPK PMP STRL ASCP (ADAPTER) ×2
APL PRP STRL LF DISP 70% ISPRP (MISCELLANEOUS) ×1
BLADE FULL RADIUS 3.5 (BLADE) IMPLANT
BLADE SHAVER 4.5X7 STR FR (MISCELLANEOUS) IMPLANT
BNDG ADH 1X3 SHEER STRL LF (GAUZE/BANDAGES/DRESSINGS) ×6 IMPLANT
BNDG ADH THN 3X1 STRL LF (GAUZE/BANDAGES/DRESSINGS) ×6
BUR BR 5.5 WIDE MOUTH (BURR) IMPLANT
CANNULA PART THRD DISP 5.75X7 (CANNULA) IMPLANT
CANNULA TWIST IN 8.25X7CM (CANNULA) ×1 IMPLANT
CHLORAPREP W/TINT 26 (MISCELLANEOUS) ×1 IMPLANT
COOLER POLAR GLACIER W/PUMP (MISCELLANEOUS) IMPLANT
DRAPE C-ARM XRAY 36X54 (DRAPES) ×1 IMPLANT
DRAPE IMP U-DRAPE 54X76 (DRAPES) ×1 IMPLANT
DRAPE SURG 17X11 SM STRL (DRAPES) ×2 IMPLANT
DRAPE U-SHAPE 47X51 STRL (DRAPES) ×2 IMPLANT
GAUZE SPONGE 4X4 12PLY STRL (GAUZE/BANDAGES/DRESSINGS) ×1 IMPLANT
GAUZE XEROFORM 1X8 LF (GAUZE/BANDAGES/DRESSINGS) ×1 IMPLANT
GLOVE BIO SURGEON STRL SZ7.5 (GLOVE) ×2 IMPLANT
GLOVE BIO SURGEON STRL SZ8 (GLOVE) ×2 IMPLANT
GLOVE BIOGEL PI IND STRL 8 (GLOVE) ×1 IMPLANT
GLOVE BIOGEL PI INDICATOR 8 (GLOVE) ×1
GLOVE SURG ORTHO 8.0 STRL STRW (GLOVE) ×1 IMPLANT
GLOVE SURG SYN 7.5  E (GLOVE) ×1
GLOVE SURG SYN 7.5 E (GLOVE) ×1 IMPLANT
GLOVE SURG SYN 7.5 PF PI (GLOVE) ×1 IMPLANT
GLOVE SURG UNDER LTX SZ8 (GLOVE) ×1 IMPLANT
GOWN STRL REUS W/ TWL LRG LVL3 (GOWN DISPOSABLE) ×1 IMPLANT
GOWN STRL REUS W/ TWL XL LVL3 (GOWN DISPOSABLE) ×1 IMPLANT
GOWN STRL REUS W/TWL LRG LVL3 (GOWN DISPOSABLE) ×1
GOWN STRL REUS W/TWL XL LVL3 (GOWN DISPOSABLE) ×1
IV NS IRRIG 3000ML ARTHROMATIC (IV SOLUTION) ×4 IMPLANT
KIT TURNOVER KIT A (KITS) ×1 IMPLANT
MANIFOLD NEPTUNE II (INSTRUMENTS) ×1 IMPLANT
MAT ABSORB  FLUID 56X50 GRAY (MISCELLANEOUS) ×1
MAT ABSORB FLUID 56X50 GRAY (MISCELLANEOUS) ×1 IMPLANT
NEEDLE HYPO 22GX1.5 SAFETY (NEEDLE) ×1 IMPLANT
NS IRRIG 500ML POUR BTL (IV SOLUTION) ×1 IMPLANT
PACK EXTREMITY ARMC (MISCELLANEOUS) ×1 IMPLANT
PAD ABD DERMACEA PRESS 5X9 (GAUZE/BANDAGES/DRESSINGS) ×1 IMPLANT
PAD WRAPON POLOR MULTI XL (MISCELLANEOUS) IMPLANT
SPONGE T-LAP 18X18 ~~LOC~~+RFID (SPONGE) ×1 IMPLANT
SUT ETHILON 3-0 FS-10 30 BLK (SUTURE) ×1
SUT VIC AB 2-0 CT2 27 (SUTURE) ×1 IMPLANT
SUTURE EHLN 3-0 FS-10 30 BLK (SUTURE) ×1 IMPLANT
SYR 10ML LL (SYRINGE) ×1 IMPLANT
SYR 20ML LL LF (SYRINGE) ×1 IMPLANT
TRAP FLUID SMOKE EVACUATOR (MISCELLANEOUS) ×1 IMPLANT
TUBING INFLOW SET DBFLO PUMP (TUBING) ×1 IMPLANT
TUBING OUTFLOW SET DBLFO PUMP (TUBING) ×1 IMPLANT
WAND HAND CNTRL MULTIVAC 50 (MISCELLANEOUS) ×1 IMPLANT
WAND WEREWOLF FLOW 90D (MISCELLANEOUS) IMPLANT
WATER STERILE IRR 500ML POUR (IV SOLUTION) ×1 IMPLANT
WRAP-ON POLOR PAD MULTI XL (MISCELLANEOUS) ×1
WRAPON POLOR PAD MULTI XL (MISCELLANEOUS) ×1

## 2021-11-14 NOTE — Discharge Instructions (Addendum)
Endoscopic Trochanteric Bursectomy   Post-Op Instructions   1. Bracing or crutches: Crutches will be provided at the time of discharge from the surgery center if you do not already have them. You can use a walker if you prefer.    2. Ice: You may be provided with a device [facility]) that allows you to ice the affected area effectively. Otherwise you can ice manually.    3. Driving:  Plan on not driving for at least one week. Please note that you are advised NOT to drive while taking narcotic pain medications as you may be impaired and unsafe to drive.   4. Activity: Ankle pumps several times an hour while awake to prevent blood clots. Weight bearing: as tolerated. Use crutches/walker as needed (usually 1-2 weeks) until pain allows you to ambulate without a limp. Avoid standing more than 5 minutes (consecutively) for the first week.  Avoid long distance travel for 2 weeks.   5. Medications:  - You have been provided a prescription for narcotic pain medicine. After surgery, take 1-2 narcotic tablets every 4 hours if needed for severe pain.  - You may take up to '3000mg'$ /day of tylenol (acetaminophen). You can take '1000mg'$  3x/day. Please check your narcotic. If you have acetaminophen in your narcotic (each tablet will be '325mg'$ ), be careful not to exceed a total of '3000mg'$ /day of acetaminophen.  - A prescription for anti-nausea medication will be provided in case the narcotic medicine causes nausea - take 1 tablet every 6 hours only if nauseated.  - Take naproxen '500mg'$  twice daily with food.  - Take enteric coated aspirin 325 mg once daily for 2 weeks to prevent blood clots.   6. Bandages: You may remove the bandages after 5 days. Drainage from the bandages (clear/reddish) can frequently occur. If this does occur, you may remove the dressing and apply another sterile dressing. You can shower after bandages are removed.    7. Physical Therapy: not needed   8. Work: May return to full work usually  around 2 weeks after 1st post-operative visit. May do light duty/desk job in approximately 1-2 weeks when off of narcotics, pain is well-controlled, and swelling has decreased. Labor intensive jobs may require 4-6 weeks to return.      9. Post-Op Appointments: Your first post-op appointment will be with Dr. Posey Pronto in approximately 2 weeks time.    If you find that they have not been scheduled please call the Orthopaedic Appointment front desk at [phone].    AMBULATORY SURGERY  DISCHARGE INSTRUCTIONS   The drugs that you were given will stay in your system until tomorrow so for the next 24 hours you should not:  Drive an automobile Make any legal decisions Drink any alcoholic beverage   You may resume regular meals tomorrow.  Today it is better to start with liquids and gradually work up to solid foods.  You may eat anything you prefer, but it is better to start with liquids, then soup and crackers, and gradually work up to solid foods.   Please notify your doctor immediately if you have any unusual bleeding, trouble breathing, redness and pain at the surgery site, drainage, fever, or pain not relieved by medication.    Additional Instructions:        Please contact your physician with any problems or Same Day Surgery at [phone], Monday through Friday 6 am to 4 pm, or No Name at [facility] number at [phone].

## 2021-11-14 NOTE — Op Note (Signed)
DATE OF SURGERY:  11/14/2021   PREOPERATIVE DIAGNOSIS:  1. Left hip trochanteric bursitis 2. Left hip iliotibial band tightness   POSTOPERATIVE DIAGNOSIS:  1. Left hip trochanteric bursitis 2. Left hip iliotibial band tightness   PROCEDURE:  1. Left hip endoscopic trochanteric bursectomy 2. Left hip endoscopic iliotibial band release  SURGEON: Cato Mulligan, MD   EBL: 5cc  ANESTHESIA: Gen  IMPLANTS: none  INDICATION(S): The patient is a 66 y.o. male who presents with persistent lateral sided hip pain. The MRI revealed significant trochanteric bursitis without significant hip abductor tearing. The patient has failed all non-operative care to date including multiple corticosteroid injections, physical therapy/exercises, medications, and activity modification. Please see the preoperative notes for further detail. The patient elected to undergo the above mentioned procedure after detailed explanation of the expected outcomes and recovery path and after discussion of risks, benefits, and alternatives to surgery.  OPERATIVE FINDINGS:  Significant trochanteric bursitis. Intact gluteus medius tendon insertion.  OPERATIVE REPORT:   The SADAT SLIWA was brought to the operating room and underwent anesthesia. The patient was placed in a supine fashion on the Hana table.  The operative extremity was flexed approximately 10 degrees and abducted approximately 20 degrees.  The foot was internally rotated. All bony prominences were padded.  Appropriate IV antibiotics were administered.  The patient was prepped and draped in a sterile fashion.  Time-out was performed and landmarks were identified with fluoroscopic assistance.  Needle localization with fluoroscopy was used to make an anterior portal.  This was made near the standard anterolateral portal at the level of the tip of the greater trochanter.  The blunt trocar was inserted deep to the IT band and along the greater trochanter.  The  peritrochanteric space was opened with a blunt trocar.  Appropriate positioning was confirmed with fluoroscopy.  A 70 degree knee arthroscope was used for this procedure, and it was inserted.  Next, a distal anterolateral portal was established approximately 7 cm distal to the anterior portal just anterior to the IT band and greater trochanter. This was also done under needle localization to ensure appropriate trajectory.  A switching stick was inserted and appropriate positioning was confirmed with fluoroscopy.  A cannula was placed over the switching stick.  A shaver was introduced.  Using combination of oscillating shaver and electrocautery wand, the significant amount of trochanteric bursa was excised.  After excision and debridement of the bursa, the gluteal sling, vastus lateralis, and gluteus medius/minimus insertion were well visualized.  There was no significant gluteus medius tear upon probing the insertion.  The leg was then placed in an adducted position.  The most prominent portion of the greater trochanter was adjacent to the IT band.  The IT band was then released via a cruciate type incision using an ArthroCare wand to reduce friction and irritation in this region of prominence.  Arthroscopic fluid was then evacuated from the joint.    Local anesthetic was injected.  Portal sites were closed with 2-0 Vicryl and 3-0 nylon sutures.  Sterile dressing was applied.  The patient was awakened from anesthesia without difficulty and transferred to PACU in stable condition.    POSTOPERATIVE PLAN:  FFWB x 1 week, WBAT with walker for 2nd week.  Can wean from walker afterwards.  ASA 325 mg daily x2 weeks for DVT prophylaxis.  Follow-up with me in approximately 2 weeks for postoperative visit.

## 2021-11-14 NOTE — Anesthesia Procedure Notes (Signed)
Procedure Name: LMA Insertion Date/Time: 11/14/2021 2:50 PM  Performed by: Cammie Sickle, CRNAPre-anesthesia Checklist: Patient identified, Patient being monitored, Timeout performed, Emergency Drugs available and Suction available Patient Re-evaluated:Patient Re-evaluated prior to induction Oxygen Delivery Method: Circle system utilized Preoxygenation: Pre-oxygenation with 100% oxygen Induction Type: IV induction Ventilation: Mask ventilation without difficulty LMA: LMA inserted LMA Size: 5.0 Tube type: Oral Number of attempts: 1 Placement Confirmation: positive ETCO2 and breath sounds checked- equal and bilateral Tube secured with: Tape Dental Injury: Teeth and Oropharynx as per pre-operative assessment

## 2021-11-14 NOTE — Transfer of Care (Signed)
Immediate Anesthesia Transfer of Care Note  Patient: Ronnie Christensen  Procedure(s) Performed: Left hip endoscopic trochanteric bursectomy, IT band release (Left: Hip)  Patient Location: PACU  Anesthesia Type:General  Level of Consciousness: drowsy  Airway & Oxygen Therapy: Patient Spontanous Breathing and Patient connected to face mask oxygen  Post-op Assessment: Report given to RN and Post -op Vital signs reviewed and stable  Post vital signs: Reviewed and stable  Last Vitals:  Vitals Value Taken Time  BP 145/95 11/14/21 1625  Temp 36.2 C 11/14/21 1625  Pulse 80 11/14/21 1629  Resp 14 11/14/21 1629  SpO2 97 % 11/14/21 1629  Vitals shown include unvalidated device data.  Last Pain:  Vitals:   11/14/21 1238  TempSrc: Oral  PainSc: 5          Complications: No notable events documented.

## 2021-11-14 NOTE — Anesthesia Postprocedure Evaluation (Signed)
Anesthesia Post Note  Patient: Ronnie Christensen  Procedure(s) Performed: Left hip endoscopic trochanteric bursectomy, IT band release (Left: Hip)  Patient location during evaluation: PACU Anesthesia Type: General Level of consciousness: awake and alert Pain management: pain level controlled Vital Signs Assessment: post-procedure vital signs reviewed and stable Respiratory status: spontaneous breathing, nonlabored ventilation, respiratory function stable and patient connected to nasal cannula oxygen Cardiovascular status: blood pressure returned to baseline and stable Postop Assessment: no apparent nausea or vomiting Anesthetic complications: no   No notable events documented.   Last Vitals:  Vitals:   11/14/21 1715 11/14/21 1728  BP: 116/68 136/78  Pulse: 69 65  Resp: 12 16  Temp: (!) 36.1 C (!) 36 C  SpO2: 99% 100%    Last Pain:  Vitals:   11/14/21 1728  TempSrc: Temporal  PainSc: [place]

## 2021-11-14 NOTE — Anesthesia Preprocedure Evaluation (Signed)
Anesthesia Evaluation  Patient identified by MRN, date of birth, ID band Patient awake    Reviewed: Allergy & Precautions, NPO status , Patient's Chart, lab work & pertinent test results  History of Anesthesia Complications Negative for: history of anesthetic complications  Airway Mallampati: IV  TM Distance: >3 FB Neck ROM: Full    Dental no notable dental hx. (+) Teeth Intact   Pulmonary asthma , neg sleep apnea, neg COPD, Patient abstained from smoking.Not current smoker, former smoker,    Pulmonary exam normal breath sounds clear to auscultation       Cardiovascular Exercise Tolerance: Good METS(-) hypertension(-) CAD and (-) Past MI negative cardio ROS  (-) dysrhythmias  Rhythm:Regular Rate:Normal - Systolic murmurs    Neuro/Psych negative neurological ROS  negative psych ROS   GI/Hepatic neg GERD  ,(+)     (-) substance abuse  ,   Endo/Other  neg diabetes  Renal/GU negative Renal ROS     Musculoskeletal  (+) Arthritis ,   Abdominal   Peds  Hematology   Anesthesia Other Findings Past Medical History: No date: Asthma No date: BPH (benign prostatic hyperplasia) No date: Chronic pain No date: Lumbar degenerative disc disease  Reproductive/Obstetrics                             Anesthesia Physical Anesthesia Plan  ASA: 2  Anesthesia Plan: General   Post-op Pain Management: Ofirmev IV (intra-op)* and Toradol IV (intra-op)*   Induction: Intravenous  PONV Risk Score and Plan: 3 and Ondansetron, Dexamethasone and Midazolam  Airway Management Planned: LMA  Additional Equipment: None  Intra-op Plan:   Post-operative Plan: Extubation in OR  Informed Consent: I have reviewed the patients History and Physical, chart, labs and discussed the procedure including the risks, benefits and alternatives for the proposed anesthesia with the patient or authorized representative who has  indicated his/her understanding and acceptance.     Dental advisory given  Plan Discussed with: CRNA and Surgeon  Anesthesia Plan Comments: (Discussed risks of anesthesia with patient, including PONV, sore throat, lip/dental/eye damage. Rare risks discussed as well, such as cardiorespiratory and neurological sequelae, and allergic reactions. Discussed the role of CRNA in patient's perioperative care. Patient understands.)        Anesthesia Quick Evaluation

## 2021-11-14 NOTE — H&P (Signed)
Paper H&P to be scanned into permanent record. H&P reviewed. No significant changes noted.  

## 2021-11-25 ENCOUNTER — Other Ambulatory Visit: Payer: Self-pay | Admitting: Orthopedic Surgery

## 2021-12-01 ENCOUNTER — Other Ambulatory Visit: Payer: Self-pay

## 2021-12-01 ENCOUNTER — Encounter
Admission: RE | Admit: 2021-12-01 | Discharge: 2021-12-01 | Disposition: A | Discharge status: Home / Self Care | Payer: PPO | Source: Ambulatory Visit | Attending: Orthopedic Surgery | Admitting: Orthopedic Surgery

## 2021-12-01 NOTE — Patient Instructions (Addendum)
Your procedure is scheduled on: 12/06/21 - Tuesday Report to the Registration Desk on the 1st floor of the [facility]. To find out your arrival time, please call [phone] between 1PM - 3PM on: 12/05/21 - Monday If your arrival time is 6:00 am, do not arrive prior to that time as the [place] entrance doors do not open until 6:00 am.  REMEMBER: Instructions that are not followed completely may result in serious medical risk, up to and including death; or upon the discretion of your surgeon and anesthesiologist your surgery may need to be rescheduled.  Do not eat food after midnight the night before surgery.  No gum chewing, lozengers or hard candies.  You may however, drink CLEAR liquids up to 2 hours before you are scheduled to arrive for your surgery. Do not drink anything within 2 hours of your scheduled arrival time.  Clear liquids include: - water  - apple juice without pulp - gatorade (not RED colors) - black coffee or tea (Do NOT add milk or creamers to the coffee or tea) Do NOT drink anything that is not on this list.  In addition, your doctor has ordered for you to drink the provided  Ensure Pre-Surgery Clear Carbohydrate Drink  Drinking this carbohydrate drink up to two hours before surgery helps to reduce insulin resistance and improve patient outcomes. Please complete drinking 2 hours prior to scheduled arrival time.  TAKE THESE MEDICATIONS THE MORNING OF SURGERY WITH A SIP OF WATER: NONE  Use inhaler albuterol (PROAIR HFA) on the day of surgery and bring to the hospital.  One week prior to surgery: Stop Anti-inflammatories (NSAIDS) such as Advil, Aleve, Ibuprofen, Motrin, Naproxen, Naprosyn and Aspirin based products such as Excedrin, Goodys Powder, BC Powder.  Stop ANY OVER THE COUNTER supplements until after surgery.  You may however, continue to take Tylenol if needed for pain up until the day of surgery.  No Alcohol for 24 hours before or after  surgery.  No Smoking including e-cigarettes for 24 hours prior to surgery.  No chewable tobacco products for at least 6 hours prior to surgery.  No nicotine patches on the day of surgery.  Do not use any "recreational" drugs for at least a week prior to your surgery.  Please be advised that the combination of cocaine and anesthesia may have negative outcomes, up to and including death. If you test positive for cocaine, your surgery will be cancelled.  On the morning of surgery brush your teeth with toothpaste and water, you may rinse your mouth with mouthwash if you wish. Do not swallow any toothpaste or mouthwash.  Use CHG Soap or wipes as directed on instruction sheet.  Do not wear jewelry, make-up, hairpins, clips or nail polish.  Do not wear lotions, powders, or perfumes.   Do not shave body from the neck down 48 hours prior to surgery just in case you cut yourself which could leave a site for infection.  Also, freshly shaved skin may become irritated if using the CHG soap.  Contact lenses, hearing aids and dentures may not be worn into surgery.  Do not bring valuables to the hospital. [facility] is not responsible for any missing/lost belongings or valuables.   Notify your doctor if there is any change in your medical condition (cold, fever, infection).  Wear comfortable clothing (specific to your surgery type) to the hospital.  After surgery, you can help prevent lung complications by doing breathing exercises.  Take deep breaths and  cough every 1-2 hours. Your doctor may order a device called an Incentive Spirometer to help you take deep breaths. When coughing or sneezing, hold a pillow firmly against your incision with both hands. This is called "splinting." Doing this helps protect your incision. It also decreases belly discomfort.  If you are being admitted to the hospital overnight, leave your suitcase in the car. After surgery it may be brought to your room.  If you  are being discharged the day of surgery, you will not be allowed to drive home. You will need a responsible adult (18 years or older) to drive you home and stay with you that night.   If you are taking public transportation, you will need to have a responsible adult (18 years or older) with you. Please confirm with your physician that it is acceptable to use public transportation.   Please call the [facility] at [phone] if you have any questions about these instructions.  Surgery Visitation Policy:  Patients undergoing a surgery or procedure may have two family members or support persons with them as long as the person is not COVID-19 positive or experiencing its symptoms.   Inpatient Visitation:    Visiting hours are 7 a.m. to 8 p.m. Up to four visitors are allowed at one time in a patient room, including children. The visitors may rotate out with other people during the day. One designated support person (adult) may remain overnight.

## 2021-12-06 ENCOUNTER — Ambulatory Visit: Payer: PPO | Admitting: Anesthesiology

## 2021-12-06 ENCOUNTER — Encounter
Admission: RE | Disposition: A | Discharge status: Home / Self Care | Payer: Self-pay | Source: Home / Self Care | Attending: Orthopedic Surgery

## 2021-12-06 ENCOUNTER — Ambulatory Visit: Payer: PPO

## 2021-12-06 ENCOUNTER — Ambulatory Visit
Admission: RE | Admit: 2021-12-06 | Discharge: 2021-12-06 | Disposition: A | Discharge status: Home / Self Care | Payer: PPO | Attending: Orthopedic Surgery | Admitting: Orthopedic Surgery

## 2021-12-06 ENCOUNTER — Encounter: Payer: Self-pay | Admitting: Orthopedic Surgery

## 2021-12-06 ENCOUNTER — Other Ambulatory Visit: Payer: Self-pay

## 2021-12-06 DIAGNOSIS — J45909 Unspecified asthma, uncomplicated: Secondary | ICD-10-CM | POA: Insufficient documentation

## 2021-12-06 DIAGNOSIS — I4510 Unspecified right bundle-branch block: Secondary | ICD-10-CM | POA: Diagnosis not present

## 2021-12-06 DIAGNOSIS — G8929 Other chronic pain: Secondary | ICD-10-CM | POA: Insufficient documentation

## 2021-12-06 DIAGNOSIS — M7631 Iliotibial band syndrome, right leg: Secondary | ICD-10-CM | POA: Insufficient documentation

## 2021-12-06 DIAGNOSIS — M7061 Trochanteric bursitis, right hip: Secondary | ICD-10-CM | POA: Insufficient documentation

## 2021-12-06 DIAGNOSIS — N400 Enlarged prostate without lower urinary tract symptoms: Secondary | ICD-10-CM | POA: Insufficient documentation

## 2021-12-06 SURGERY — ARTHROSCOPIC TROCHANTERIC BURSECTOMY
Anesthesia: General | Site: Hip | Laterality: Right

## 2021-12-06 MED ORDER — LIDOCAINE HCL (CARDIAC) PF 100 MG/5ML IV SOSY
PREFILLED_SYRINGE | INTRAVENOUS | Status: DC | PRN
  Administered 2021-12-06: 80 mg via INTRAVENOUS

## 2021-12-06 MED ORDER — ACETAMINOPHEN 10 MG/ML IV SOLN
INTRAVENOUS | Status: DC | PRN
  Administered 2021-12-06: 1000 mg via INTRAVENOUS

## 2021-12-06 MED ORDER — OXYCODONE HCL 5 MG PO TABS: 5.0000 mg | ORAL_TABLET | Freq: Once | ORAL | Status: DC | PRN

## 2021-12-06 MED ORDER — ONDANSETRON HCL 4 MG/2ML IJ SOLN
INTRAMUSCULAR | Status: AC
  Filled 2021-12-06: qty 2

## 2021-12-06 MED ORDER — PHENYLEPHRINE HCL-NACL 20-0.9 MG/250ML-% IV SOLN
INTRAVENOUS | Status: DC | PRN
  Administered 2021-12-06: 30 ug/min via INTRAVENOUS

## 2021-12-06 MED ORDER — FENTANYL CITRATE (PF) 100 MCG/2ML IJ SOLN
INTRAMUSCULAR | Status: DC | PRN
  Administered 2021-12-06 (×2): 50 ug via INTRAVENOUS
  Administered 2021-12-06: 100 ug via INTRAVENOUS

## 2021-12-06 MED ORDER — HYDROCODONE-ACETAMINOPHEN 5-325 MG PO TABS: 1.0000 | ORAL_TABLET | ORAL | 0 refills | Status: AC | PRN

## 2021-12-06 MED ORDER — ONDANSETRON HCL 4 MG/2ML IJ SOLN: 4.0000 mg | Freq: Once | INTRAMUSCULAR | Status: DC | PRN

## 2021-12-06 MED ORDER — SUGAMMADEX SODIUM 500 MG/5ML IV SOLN
INTRAVENOUS | Status: DC | PRN
  Administered 2021-12-06: 250 mg via INTRAVENOUS

## 2021-12-06 MED ORDER — CEFAZOLIN SODIUM-DEXTROSE 2-4 GM/100ML-% IV SOLN
2.0000 g | INTRAVENOUS | Status: AC
  Administered 2021-12-06: 2 g via INTRAVENOUS

## 2021-12-06 MED ORDER — CHLORHEXIDINE GLUCONATE 0.12 % MT SOLN: 15.0000 mL | Freq: Once | OROMUCOSAL | Status: AC

## 2021-12-06 MED ORDER — FAMOTIDINE 20 MG PO TABS: 20.0000 mg | ORAL_TABLET | Freq: Once | ORAL | Status: AC

## 2021-12-06 MED ORDER — BUPIVACAINE HCL (PF) 0.5 % IJ SOLN
INTRAMUSCULAR | Status: DC | PRN
  Administered 2021-12-06: 7.5 mL

## 2021-12-06 MED ORDER — MIDAZOLAM HCL 2 MG/2ML IJ SOLN
INTRAMUSCULAR | Status: DC | PRN
  Administered 2021-12-06: 2 mg via INTRAVENOUS

## 2021-12-06 MED ORDER — KETOROLAC TROMETHAMINE 30 MG/ML IJ SOLN
INTRAMUSCULAR | Status: AC
  Filled 2021-12-06: qty 1

## 2021-12-06 MED ORDER — ACETAMINOPHEN 10 MG/ML IV SOLN
INTRAVENOUS | Status: AC
  Filled 2021-12-06: qty 100

## 2021-12-06 MED ORDER — OXYCODONE HCL 5 MG/5ML PO SOLN: 5.0000 mg | Freq: Once | ORAL | Status: DC | PRN

## 2021-12-06 MED ORDER — DEXAMETHASONE SODIUM PHOSPHATE 10 MG/ML IJ SOLN
INTRAMUSCULAR | Status: DC | PRN
  Administered 2021-12-06: 10 mg via INTRAVENOUS

## 2021-12-06 MED ORDER — LACTATED RINGERS IV SOLN: INTRAVENOUS | Status: DC

## 2021-12-06 MED ORDER — CHLORHEXIDINE GLUCONATE 0.12 % MT SOLN
OROMUCOSAL | Status: AC
  Administered 2021-12-06: 15 mL via OROMUCOSAL
  Filled 2021-12-06: qty 15

## 2021-12-06 MED ORDER — LIDOCAINE HCL (PF) 2 % IJ SOLN
INTRAMUSCULAR | Status: AC
  Filled 2021-12-06: qty 5

## 2021-12-06 MED ORDER — FENTANYL CITRATE (PF) 100 MCG/2ML IJ SOLN: 25.0000 ug | INTRAMUSCULAR | Status: DC | PRN

## 2021-12-06 MED ORDER — LIDOCAINE-EPINEPHRINE 1 %-1:100000 IJ SOLN
INTRAMUSCULAR | Status: DC | PRN
  Administered 2021-12-06: 7.5 mL

## 2021-12-06 MED ORDER — ACETAMINOPHEN 10 MG/ML IV SOLN: 1000.0000 mg | Freq: Once | INTRAVENOUS | Status: DC | PRN

## 2021-12-06 MED ORDER — PHENYLEPHRINE 80 MCG/ML (10ML) SYRINGE FOR IV PUSH (FOR BLOOD PRESSURE SUPPORT)
PREFILLED_SYRINGE | INTRAVENOUS | Status: DC | PRN
  Administered 2021-12-06: 80 ug via INTRAVENOUS
  Administered 2021-12-06: 160 ug via INTRAVENOUS
  Administered 2021-12-06: 80 ug via INTRAVENOUS

## 2021-12-06 MED ORDER — ASPIRIN 325 MG PO TBEC: 325.0000 mg | DELAYED_RELEASE_TABLET | Freq: Every day | ORAL | 0 refills | Status: AC

## 2021-12-06 MED ORDER — EPHEDRINE SULFATE (PRESSORS) 50 MG/ML IJ SOLN
INTRAMUSCULAR | Status: DC | PRN
  Administered 2021-12-06 (×2): 10 mg via INTRAVENOUS

## 2021-12-06 MED ORDER — KETOROLAC TROMETHAMINE 30 MG/ML IJ SOLN
INTRAMUSCULAR | Status: DC | PRN
  Administered 2021-12-06: 30 mg via INTRAVENOUS

## 2021-12-06 MED ORDER — PROPOFOL 10 MG/ML IV BOLUS
INTRAVENOUS | Status: DC | PRN
  Administered 2021-12-06: 200 mg via INTRAVENOUS

## 2021-12-06 MED ORDER — PROPOFOL 10 MG/ML IV BOLUS
INTRAVENOUS | Status: AC
  Filled 2021-12-06: qty 20

## 2021-12-06 MED ORDER — ORAL CARE MOUTH RINSE: 15.0000 mL | Freq: Once | OROMUCOSAL | Status: AC

## 2021-12-06 MED ORDER — NAPROXEN 500 MG PO TABS: 500.0000 mg | ORAL_TABLET | Freq: Two times a day (BID) | ORAL | 0 refills | Status: AC

## 2021-12-06 MED ORDER — LIDOCAINE-EPINEPHRINE 1 %-1:100000 IJ SOLN
INTRAMUSCULAR | Status: AC
  Filled 2021-12-06: qty 1

## 2021-12-06 MED ORDER — ONDANSETRON HCL 4 MG/2ML IJ SOLN
INTRAMUSCULAR | Status: DC | PRN
  Administered 2021-12-06: 4 mg via INTRAVENOUS

## 2021-12-06 MED ORDER — RINGERS IRRIGATION IR SOLN
Status: DC | PRN
  Administered 2021-12-06: 12000 mL
  Administered 2021-12-06 (×2): 6000 mL

## 2021-12-06 MED ORDER — ROCURONIUM BROMIDE 100 MG/10ML IV SOLN
INTRAVENOUS | Status: DC | PRN
  Administered 2021-12-06: 50 mg via INTRAVENOUS

## 2021-12-06 MED ORDER — PHENYLEPHRINE 80 MCG/ML (10ML) SYRINGE FOR IV PUSH (FOR BLOOD PRESSURE SUPPORT)
PREFILLED_SYRINGE | INTRAVENOUS | Status: AC
  Filled 2021-12-06: qty 10

## 2021-12-06 MED ORDER — MIDAZOLAM HCL 2 MG/2ML IJ SOLN
INTRAMUSCULAR | Status: AC
  Filled 2021-12-06: qty 2

## 2021-12-06 MED ORDER — FAMOTIDINE 20 MG PO TABS
ORAL_TABLET | ORAL | Status: AC
  Administered 2021-12-06: 20 mg via ORAL
  Filled 2021-12-06: qty 1

## 2021-12-06 MED ORDER — ONDANSETRON 4 MG PO TBDP: 4.0000 mg | ORAL_TABLET | Freq: Three times a day (TID) | ORAL | 0 refills | Status: AC | PRN

## 2021-12-06 MED ORDER — FENTANYL CITRATE (PF) 250 MCG/5ML IJ SOLN
INTRAMUSCULAR | Status: AC
  Filled 2021-12-06: qty 5

## 2021-12-06 MED ORDER — DEXAMETHASONE SODIUM PHOSPHATE 10 MG/ML IJ SOLN
INTRAMUSCULAR | Status: AC
  Filled 2021-12-06: qty 1

## 2021-12-06 MED ORDER — BUPIVACAINE HCL (PF) 0.5 % IJ SOLN
INTRAMUSCULAR | Status: AC
  Filled 2021-12-06: qty 30

## 2021-12-06 MED ORDER — CEFAZOLIN SODIUM-DEXTROSE 2-4 GM/100ML-% IV SOLN
INTRAVENOUS | Status: AC
  Filled 2021-12-06: qty 100

## 2021-12-06 MED ORDER — PHENYLEPHRINE HCL-NACL 20-0.9 MG/250ML-% IV SOLN
INTRAVENOUS | Status: AC
  Filled 2021-12-06: qty 250

## 2021-12-06 MED ORDER — ROCURONIUM BROMIDE 10 MG/ML (PF) SYRINGE
PREFILLED_SYRINGE | INTRAVENOUS | Status: AC
  Filled 2021-12-06: qty 10

## 2021-12-06 SURGICAL SUPPLY — 51 items
ADAPTER IRRIG TUBE 2 SPIKE SOL (ADAPTER) ×2 IMPLANT
ADPR TBG 2 SPK PMP STRL ASCP (ADAPTER) ×2
APL PRP STRL LF DISP 70% ISPRP (MISCELLANEOUS) ×1
BLADE FULL RADIUS 3.5 (BLADE) IMPLANT
BLADE SHAVER 4.5X7 STR FR (MISCELLANEOUS) IMPLANT
BNDG ADH 1X3 SHEER STRL LF (GAUZE/BANDAGES/DRESSINGS) ×6 IMPLANT
BNDG ADH THN 3X1 STRL LF (GAUZE/BANDAGES/DRESSINGS) ×1
BUR BR 5.5 WIDE MOUTH (BURR) IMPLANT
CANNULA TWIST IN 8.25X7CM (CANNULA) ×1 IMPLANT
CHLORAPREP W/TINT 26 (MISCELLANEOUS) ×1 IMPLANT
DRAPE C-ARM XRAY 36X54 (DRAPES) ×1 IMPLANT
DRAPE IMP U-DRAPE 54X76 (DRAPES) ×1 IMPLANT
DRAPE SURG 17X11 SM STRL (DRAPES) ×2 IMPLANT
DRAPE U-SHAPE 47X51 STRL (DRAPES) ×2 IMPLANT
GAUZE SPONGE 4X4 12PLY STRL (GAUZE/BANDAGES/DRESSINGS) ×1 IMPLANT
GAUZE XEROFORM 1X8 LF (GAUZE/BANDAGES/DRESSINGS) ×1 IMPLANT
GLOVE BIO SURGEON STRL SZ7.5 (GLOVE) ×2 IMPLANT
GLOVE BIO SURGEON STRL SZ8 (GLOVE) ×2 IMPLANT
GLOVE BIOGEL PI IND STRL 8 (GLOVE) ×1 IMPLANT
GLOVE SURG ORTHO 8.0 STRL STRW (GLOVE) ×1 IMPLANT
GLOVE SURG SYN 7.5  E (GLOVE) ×1
GLOVE SURG SYN 7.5 E (GLOVE) ×1 IMPLANT
GLOVE SURG SYN 7.5 PF PI (GLOVE) ×1 IMPLANT
GLOVE SURG UNDER LTX SZ8 (GLOVE) ×1 IMPLANT
GOWN STRL REUS W/ TWL LRG LVL3 (GOWN DISPOSABLE) ×1 IMPLANT
GOWN STRL REUS W/ TWL XL LVL3 (GOWN DISPOSABLE) ×1 IMPLANT
GOWN STRL REUS W/TWL LRG LVL3 (GOWN DISPOSABLE) ×1
GOWN STRL REUS W/TWL XL LVL3 (GOWN DISPOSABLE) ×1
IV LACTATED RINGER IRRG 3000ML (IV SOLUTION) ×8
IV LR IRRIG 3000ML ARTHROMATIC (IV SOLUTION) IMPLANT
IV NS IRRIG 3000ML ARTHROMATIC (IV SOLUTION) ×4 IMPLANT
KIT TURNOVER KIT A (KITS) ×1 IMPLANT
MANIFOLD NEPTUNE II (INSTRUMENTS) ×1 IMPLANT
MAT ABSORB  FLUID 56X50 GRAY (MISCELLANEOUS) ×1
MAT ABSORB FLUID 56X50 GRAY (MISCELLANEOUS) ×1 IMPLANT
NEEDLE HYPO 22GX1.5 SAFETY (NEEDLE) ×1 IMPLANT
NS IRRIG 500ML POUR BTL (IV SOLUTION) ×1 IMPLANT
PACK EXTREMITY ARMC (MISCELLANEOUS) ×1 IMPLANT
PAD ABD DERMACEA PRESS 5X9 (GAUZE/BANDAGES/DRESSINGS) ×1 IMPLANT
SPONGE T-LAP 18X18 ~~LOC~~+RFID (SPONGE) ×1 IMPLANT
SUT ETHILON 3-0 FS-10 30 BLK (SUTURE) ×1
SUT VIC AB 2-0 CT2 27 (SUTURE) ×1 IMPLANT
SUTURE EHLN 3-0 FS-10 30 BLK (SUTURE) ×1 IMPLANT
SYR 10ML LL (SYRINGE) ×1 IMPLANT
SYR 20ML LL LF (SYRINGE) ×1 IMPLANT
TRAP FLUID SMOKE EVACUATOR (MISCELLANEOUS) ×1 IMPLANT
TUBING INFLOW SET DBFLO PUMP (TUBING) ×1 IMPLANT
TUBING OUTFLOW SET DBLFO PUMP (TUBING) ×1 IMPLANT
WAND HAND CNTRL MULTIVAC 50 (MISCELLANEOUS) ×1 IMPLANT
WAND WEREWOLF FLOW 90D (MISCELLANEOUS) IMPLANT
WATER STERILE IRR 500ML POUR (IV SOLUTION) ×1 IMPLANT

## 2021-12-06 NOTE — Op Note (Signed)
DATE OF SURGERY:  12/06/2021   PREOPERATIVE DIAGNOSIS:  1. Right hip trochanteric bursitis 2. Right hip iliotibial band tightness   POSTOPERATIVE DIAGNOSIS:  1. Right hip trochanteric bursitis 2. Right hip iliotibial band tightness   PROCEDURE:  1. Right hip endoscopic trochanteric bursectomy 2. Right hip endoscopic iliotibial band release  SURGEON: Cato Mulligan, MD  ASSISTANT: Anitra Lauth, PA; Evette Georges, PA-S    EBL: 5cc  ANESTHESIA: Gen  IMPLANTS: none  INDICATION(S): The patient is a 66 y.o. male who presents with persistent lateral sided hip pain. The MRI revealed significant trochanteric bursitis without significant hip abductor tearing.  The patient has failed all non-operative care to date including multiple corticosteroid injections, physical therapy/exercises, medications, and activity modification.  Please see the preoperative notes for further detail.  Of note, he underwent contralateral trochanteric bursectomy and IT band release on 11/14/2021 and has had excellent pain relief.  The patient elected to undergo the above mentioned procedure after detailed explanation of the expected outcomes and recovery path and after discussion of risks, benefits, and alternatives to surgery  OPERATIVE FINDINGS:  Significant trochanteric bursitis. Intact gluteus medius/minimus tendon insertion.  OPERATIVE REPORT:   The Ronnie Christensen was brought to the operating room and underwent anesthesia. The patient was placed in a supine fashion on the Hana table.  The operative extremity was flexed approximately 10 degrees and abducted approximately 30 degrees.  The foot was internally rotated. All bony prominences were padded.  Appropriate IV antibiotics were administered.  The patient was prepped and draped in a sterile fashion.  Time-out was performed and landmarks were identified with fluoroscopic assistance.  Needle localization with fluoroscopy was used to make an anterior portal.  This  was made approximately 3 cm anterior to the standard anterolateral portal at the level of the tip of the greater trochanter.  The blunt trocar was inserted deep to the IT band and along the greater trochanter.  The peritrochanteric space was opened with a blunt trocar.  Appropriate positioning was confirmed with fluoroscopy.  A 70 degree knee arthroscope was used for this procedure, and it was inserted.  Next a distal anterolateral portal was established approximately 7 cm distal to the anterior portal just anterior to the IT band and greater trochanter. This was also done under needle localization to ensure appropriate trajectory.  A shaver was introduced.  Using combination of oscillating shaver and electrocautery wand, the significant amount of trochanteric bursa was excised.  After excision and debridement of the bursa, the gluteal sling, vastus lateralis, and gluteus medius/minimus insertion were well visualized.  There was no significant gluteus medius tear upon probing the insertion.  The leg was then placed in an adducted position.  The most prominent portion of the greater trochanter was adjacent to the IT band.  The IT band was then released via a cruciate type incision using an ArthroCare wand to reduce friction and irritation in this region of prominence.  Arthroscopic fluid was then evacuated from the joint.    Local anesthetic was injected.  Portal sites were closed with 2-0 Vicryl and 3-0 nylon sutures.  Sterile dressing was applied.  The patient was awakened from anesthesia without difficulty and transferred to PACU in stable condition.   Of note, assistance from a PA was essential to performing the surgery.  PA was present for the entire surgery.  PA assisted with patient positioning, retraction, instrumentation, and wound closure. The surgery would have been more difficult and had longer operative  time without PA assistance.    POSTOPERATIVE PLAN:  FFWB x 1 week, WBAT with walker for 2nd  week.  Can wean from walker afterwards.  ASA 325 mg daily x2 weeks for DVT prophylaxis.  Follow-up with me in approximately 2 weeks for postoperative visit.

## 2021-12-06 NOTE — Discharge Instructions (Addendum)
Endoscopic Trochanteric Bursectomy   Post-Op Instructions   1. Bracing or crutches: Crutches will be provided at the time of discharge from the surgery center if you do not already have them. You can use a walker if you prefer.    2. Ice: You may be provided with a device [facility]) that allows you to ice the affected area effectively. Otherwise you can ice manually.    3. Driving:  Plan on not driving for at least one week. Please note that you are advised NOT to drive while taking narcotic pain medications as you may be impaired and unsafe to drive.   4. Activity: Ankle pumps several times an hour while awake to prevent blood clots. Weight bearing: as tolerated. Use crutches/walker as needed (usually 1-2 weeks) until pain allows you to ambulate without a limp. Avoid standing more than 5 minutes (consecutively) for the first week.  Avoid long distance travel for 2 weeks.   5. Medications:  - You have been provided a prescription for narcotic pain medicine. After surgery, take 1-2 narcotic tablets every 4 hours if needed for severe pain.  - You may take up to '3000mg'$ /day of tylenol (acetaminophen). You can take '1000mg'$  3x/day. Please check your narcotic. If you have acetaminophen in your narcotic (each tablet will be '325mg'$ ), be careful not to exceed a total of '3000mg'$ /day of acetaminophen.  - A prescription for anti-nausea medication will be provided in case the narcotic medicine causes nausea - take 1 tablet every 6 hours only if nauseated.  - Take naproxen '500mg'$  twice daily with food.  - Take enteric coated aspirin 325 mg once daily for 2 weeks to prevent blood clots.   6. Bandages: You may remove the bandages after 5 days. Drainage from the bandages (clear/reddish) can frequently occur. If this does occur, you may remove the dressing and apply another sterile dressing. You can shower after bandages are removed.    7. Physical Therapy: not needed   8. Work: May return to full work usually  around 2 weeks after 1st post-operative visit. May do light duty/desk job in approximately 1-2 weeks when off of narcotics, pain is well-controlled, and swelling has decreased. Labor intensive jobs may require 4-6 weeks to return.      9. Post-Op Appointments: Your first post-op appointment will be with Dr. Posey Pronto in approximately 2 weeks time.    If you find that they have not been scheduled please call the Orthopaedic Appointment front desk at [phone].    AMBULATORY SURGERY  DISCHARGE INSTRUCTIONS   The drugs that you were given will stay in your system until tomorrow so for the next 24 hours you should not:  Drive an automobile Make any legal decisions Drink any alcoholic beverage   You may resume regular meals tomorrow.  Today it is better to start with liquids and gradually work up to solid foods.  You may eat anything you prefer, but it is better to start with liquids, then soup and crackers, and gradually work up to solid foods.   Please notify your doctor immediately if you have any unusual bleeding, trouble breathing, redness and pain at the surgery site, drainage, fever, or pain not relieved by medication.    Additional Instructions:   Please contact your physician with any problems or Same Day Surgery at [phone], Monday through Friday 6 am to 4 pm, or [place] [facility] number at [phone].

## 2021-12-06 NOTE — H&P (Signed)
Paper H&P to be scanned into permanent record. H&P reviewed. No significant changes noted.  

## 2021-12-06 NOTE — Anesthesia Procedure Notes (Signed)
Procedure Name: Intubation Date/Time: 12/06/2021 12:08 PM  Performed by: Nelda Marseille, CRNAPre-anesthesia Checklist: Patient identified, Patient being monitored, Timeout performed, Emergency Drugs available and Suction available Patient Re-evaluated:Patient Re-evaluated prior to induction Oxygen Delivery Method: Circle system utilized Preoxygenation: Pre-oxygenation with 100% oxygen Induction Type: IV induction Ventilation: Mask ventilation without difficulty Laryngoscope Size: Mac, 3 and McGraph Grade View: Grade I Tube type: Oral Tube size: 7.5 mm Number of attempts: 1 Airway Equipment and Method: Stylet Placement Confirmation: ETT inserted through vocal cords under direct vision, positive ETCO2 and breath sounds checked- equal and bilateral Secured at: 21 cm Tube secured with: Tape Dental Injury: Teeth and Oropharynx as per pre-operative assessment

## 2021-12-06 NOTE — Transfer of Care (Signed)
Immediate Anesthesia Transfer of Care Note  Patient: Ronnie Christensen  Procedure(s) Performed: Right hip endoscopic trochanteric bursectomy, IT band release (Right: Hip)  Patient Location: PACU  Anesthesia Type:General  Level of Consciousness: awake, alert  and oriented  Airway & Oxygen Therapy: Patient Spontanous Breathing and Patient connected to face mask oxygen  Post-op Assessment: Report given to RN, Post -op Vital signs reviewed and stable and Patient moving all extremities  Post vital signs: Reviewed and stable  Last Vitals:  Vitals Value Taken Time  BP 139/83 12/06/21 1332  Temp 36.5 C 12/06/21 1332  Pulse 80 12/06/21 1337  Resp 16 12/06/21 1337  SpO2 98 % 12/06/21 1337  Vitals shown include unvalidated device data.  Last Pain:  Vitals:   12/06/21 0923  TempSrc: Temporal  PainSc: 8          Complications: No notable events documented.

## 2021-12-06 NOTE — Anesthesia Preprocedure Evaluation (Addendum)
Anesthesia Evaluation  Patient identified by MRN, date of birth, ID band Patient awake    Reviewed: Allergy & Precautions, NPO status , Patient's Chart, lab work & pertinent test results  History of Anesthesia Complications Negative for: history of anesthetic complications  Airway Mallampati: IV   Neck ROM: Full    Dental  (+) Missing   Pulmonary asthma , former smoker (quit age 66),    Pulmonary exam normal breath sounds clear to auscultation       Cardiovascular Exercise Tolerance: Good Normal cardiovascular exam Rhythm:Regular Rate:Normal  ECG 11/11/21: NSR, RBBB   Neuro/Psych Chronic pain    GI/Hepatic negative GI ROS,   Endo/Other  negative endocrine ROS  Renal/GU negative Renal ROS   BPH    Musculoskeletal  (+) Arthritis ,   Abdominal   Peds  Hematology negative hematology ROS (+)   Anesthesia Other Findings   Reproductive/Obstetrics                            Anesthesia Physical Anesthesia Plan  ASA: 2  Anesthesia Plan: General   Post-op Pain Management:    Induction: Intravenous  PONV Risk Score and Plan: 2 and Ondansetron, Dexamethasone and Treatment may vary due to age or medical condition  Airway Management Planned: Oral ETT  Additional Equipment:   Intra-op Plan:   Post-operative Plan: Extubation in OR  Informed Consent: I have reviewed the patients History and Physical, chart, labs and discussed the procedure including the risks, benefits and alternatives for the proposed anesthesia with the patient or authorized representative who has indicated his/her understanding and acceptance.     Dental advisory given  Plan Discussed with: CRNA  Anesthesia Plan Comments: (Patient consented for risks of anesthesia including but not limited to:  - adverse reactions to medications - damage to eyes, teeth, lips or other oral mucosa - nerve damage due to positioning   - sore throat or hoarseness - damage to heart, brain, nerves, lungs, other parts of body or loss of life  Informed patient about role of CRNA in peri- and intra-operative care.  Patient voiced understanding.)       Anesthesia Quick Evaluation

## 2021-12-07 NOTE — Anesthesia Postprocedure Evaluation (Signed)
Anesthesia Post Note  Patient: Ronnie Christensen  Procedure(s) Performed: Right hip endoscopic trochanteric bursectomy, IT band release (Right: Hip)  Patient location during evaluation: PACU Anesthesia Type: General Level of consciousness: awake and alert, oriented and patient cooperative Pain management: pain level controlled Vital Signs Assessment: post-procedure vital signs reviewed and stable Respiratory status: spontaneous breathing, nonlabored ventilation and respiratory function stable Cardiovascular status: blood pressure returned to baseline and stable Postop Assessment: adequate PO intake Anesthetic complications: no   No notable events documented.   Last Vitals:  Vitals:   12/06/21 1426 12/06/21 1514  BP: (!) 143/94   Pulse: 65 (P) 77  Resp: 16 (P) 16  Temp: 36.4 C   SpO2: 100% (P) 99%    Last Pain:  Vitals:   12/07/21 0832  TempSrc:   PainSc: 0-No pain                 Darrin Nipper
